# Patient Record
Sex: Male | Born: 1980 | Race: Black or African American | Hispanic: No | Marital: Single | Smoking: Current every day smoker
Health system: Southern US, Community
[De-identification: ages and names within clinical notes are randomized; demographics above are authoritative.]

## PROBLEM LIST (undated history)

## (undated) DIAGNOSIS — I10 Essential (primary) hypertension: Secondary | ICD-10-CM

## (undated) HISTORY — PX: MANDIBLE FRACTURE SURGERY: SHX706

---

## 2007-06-17 ENCOUNTER — Emergency Department: Payer: Self-pay | Admitting: Emergency Medicine

## 2007-06-27 ENCOUNTER — Ambulatory Visit: Payer: Self-pay | Admitting: Specialist

## 2008-10-21 ENCOUNTER — Emergency Department: Payer: Self-pay | Admitting: Internal Medicine

## 2009-04-19 ENCOUNTER — Emergency Department: Payer: Self-pay | Admitting: Unknown Physician Specialty

## 2010-12-14 ENCOUNTER — Emergency Department: Payer: Self-pay | Admitting: Emergency Medicine

## 2010-12-16 ENCOUNTER — Ambulatory Visit: Payer: Self-pay | Admitting: Internal Medicine

## 2012-08-09 ENCOUNTER — Emergency Department: Payer: Self-pay | Admitting: Internal Medicine

## 2012-11-02 ENCOUNTER — Emergency Department: Payer: Self-pay | Admitting: Emergency Medicine

## 2014-10-21 ENCOUNTER — Emergency Department: Payer: 59

## 2014-10-21 ENCOUNTER — Emergency Department
Admission: EM | Admit: 2014-10-21 | Discharge: 2014-10-21 | Disposition: A | Discharge status: Home / Self Care | Payer: 59 | Attending: Emergency Medicine | Admitting: Emergency Medicine

## 2014-10-21 ENCOUNTER — Encounter: Payer: Self-pay | Admitting: Emergency Medicine

## 2014-10-21 DIAGNOSIS — Z720 Tobacco use: Secondary | ICD-10-CM | POA: Diagnosis not present

## 2014-10-21 DIAGNOSIS — J189 Pneumonia, unspecified organism: Secondary | ICD-10-CM | POA: Diagnosis not present

## 2014-10-21 DIAGNOSIS — R509 Fever, unspecified: Secondary | ICD-10-CM | POA: Diagnosis present

## 2014-10-21 DIAGNOSIS — J181 Lobar pneumonia, unspecified organism: Secondary | ICD-10-CM

## 2014-10-21 MED ORDER — PSEUDOEPHEDRINE HCL 30 MG PO TABS: 30.0000 mg | ORAL_TABLET | Freq: Four times a day (QID) | ORAL | Status: DC | PRN

## 2014-10-21 MED ORDER — LEVOFLOXACIN 500 MG PO TABS: 500.0000 mg | ORAL_TABLET | Freq: Every day | ORAL | Status: AC

## 2014-10-21 MED ORDER — GUAIFENESIN-CODEINE 100-10 MG/5ML PO SOLN: 10.0000 mL | ORAL | Status: DC | PRN

## 2014-10-21 MED ORDER — IBUPROFEN 800 MG PO TABS: 800.0000 mg | ORAL_TABLET | Freq: Three times a day (TID) | ORAL | Status: DC | PRN

## 2014-10-21 NOTE — ED Notes (Signed)
Pt here with neck stiffness, pt states that he has had stiffness for the last several days, that he has been running a fever. Pt states that he has had cough and congestion.  Pt states that he has been coughing up blood.

## 2014-10-21 NOTE — Discharge Instructions (Signed)

## 2014-10-21 NOTE — ED Notes (Signed)
Reports "crook in my neck " x 2 days.  Also reports cough, congestion and chills

## 2014-10-21 NOTE — ED Provider Notes (Signed)
[facility] Emergency Department Provider Note    ____________________________________________  Time seen: 12:30  I have reviewed the triage vital signs and the nursing notes.   HISTORY  Chief Complaint Influenza       HPI Paul Good is a 34 y.o. male presents to the ER with complaints of fever chills body aches cough congestion onset about 1 day ago. Has tried several different over-the-counter medications with no relief yesterday and today, just feels bad unable to work. Reports that he is coughing up blood mixed with green sputum suggests today. Unable to get comfortable and better find a spot comfortable to rest symptoms are exacerbated with laying down and relieved somewhat with upright position.    History reviewed. No pertinent past medical history.  There are no active problems to display for this patient.   Past Surgical History  Procedure Laterality Date  . Mandible fracture surgery Bilateral     Current Outpatient Rx  Name  Route  Sig  Dispense  Refill  . guaiFENesin-codeine 100-10 MG/5ML syrup   Oral   Take 10 mLs by mouth every 4 (four) hours as needed for cough.   240 mL   0   . ibuprofen (ADVIL,MOTRIN) 800 MG tablet   Oral   Take 1 tablet (800 mg total) by mouth every 8 (eight) hours as needed.   30 tablet   0   . levofloxacin (LEVAQUIN) 500 MG tablet   Oral   Take 1 tablet (500 mg total) by mouth daily.   10 tablet   0   . pseudoephedrine (SUDAFED) 30 MG tablet   Oral   Take 1 tablet (30 mg total) by mouth every 6 (six) hours as needed for congestion.   24 tablet   2     Allergies Review of patient's allergies indicates no known allergies.  History reviewed. No pertinent family history.  Social History History  Substance Use Topics  . Smoking status: Current Every Day Smoker  . Smokeless tobacco: Not on file  . Alcohol Use: Yes    Review of Systems Constitutional: Negative for fever. Eyes:  Negative for visual changes. ENT: Negative for sore throat. Cardiovascular: Negative for chest pain. Respiratory: Negative for shortness of breath. Decreased breath sounds bilaterally. Gastrointestinal: Negative for abdominal pain, vomiting and diarrhea. Genitourinary: Negative for dysuria. Musculoskeletal: Negative for back pain. Skin: Negative for rash. Neurological: Negative for headaches, focal weakness or numbness.   10-point ROS otherwise negative.  ____________________________________________   PHYSICAL EXAM:  VITAL SIGNS: ED Triage Vitals  Enc Vitals Group     BP 10/21/14 1111 149/98 mmHg     Pulse Rate 10/21/14 1111 78     Resp 10/21/14 1111 18     Temp 10/21/14 1111 98.6 F (37 C)     Temp Source 10/21/14 1111 Oral     SpO2 10/21/14 1111 100 %     Weight 10/21/14 1111 185 lb (83.915 kg)     Height 10/21/14 1111 6' (1.829 m)     Head Cir --      Peak Flow --      Pain Score 10/21/14 1112 10     Pain Loc --      Pain Edu? --      Excl. in GC? --      Constitutional: Alert and oriented. Well appearing and in no distress. Eyes: Conjunctivae are normal. PERRL. Normal extraocular movements. ENT   Head: Normocephalic and atraumatic.   Nose: No  congestion/rhinnorhea.   Mouth/Throat: Mucous membranes are moist.   Neck: No stridor. Hematological/Lymphatic/Immunilogical: No cervical lymphadenopathy. Cardiovascular: Normal rate, regular rhythm. Normal and symmetric distal pulses are present in all extremities. No murmurs, rubs, or gallops. Respiratory: Normal respiratory effort without tachypnea nor retractions. Breath sounds are clear, although decreased and equal bilaterally. No wheezes/rales/rhonchi. Coughs during entire exam with inspiration. Symptoms exacerbated with deep breathing.  Musculoskeletal: Nontender with normal range of motion in all extremities. No joint effusions.  No lower extremity tenderness nor edema. Neurologic:  Normal speech and  language. No gross focal neurologic deficits are appreciated. Speech is normal. No gait instability. Skin:  Skin is warm, dry and intact. No rash noted. Psychiatric: Mood and affect are normal. Speech and behavior are normal. Patient exhibits appropriate insight and judgment.  ____________________________________________    LABS (pertinent positives/negatives)    ____________________________________________   EKG   ____________________________________________    RADIOLOGY  Reviewed and discussed the findings with patient  ____________________________________________   PROCEDURES  Procedure(s) performed: None  Critical Care performed: No  ____________________________________________   INITIAL IMPRESSION / ASSESSMENT AND PLAN / ED COURSE  Pertinent labs & imaging results that were available during my care of the patient were reviewed by me and considered in my medical decision making (see chart for details).    ____________________________________________   FINAL CLINICAL IMPRESSION(S) / ED DIAGNOSES  Final diagnoses:  Left lower lobe pneumonia     Evangeline DakinCharles M Chosen Garron, PA-C 10/21/14 1420  Sharman CheekPhillip Stafford, MD 10/23/14 [phone]

## 2017-12-15 ENCOUNTER — Emergency Department
Admission: EM | Admit: 2017-12-15 | Discharge: 2017-12-15 | Disposition: A | Discharge status: Home / Self Care | Payer: 59 | Attending: Emergency Medicine | Admitting: Emergency Medicine

## 2017-12-15 ENCOUNTER — Encounter: Payer: Self-pay | Admitting: Emergency Medicine

## 2017-12-15 ENCOUNTER — Other Ambulatory Visit: Payer: Self-pay

## 2017-12-15 ENCOUNTER — Emergency Department: Payer: 59

## 2017-12-15 DIAGNOSIS — L089 Local infection of the skin and subcutaneous tissue, unspecified: Secondary | ICD-10-CM | POA: Insufficient documentation

## 2017-12-15 DIAGNOSIS — S6992XA Unspecified injury of left wrist, hand and finger(s), initial encounter: Secondary | ICD-10-CM | POA: Diagnosis present

## 2017-12-15 DIAGNOSIS — F17210 Nicotine dependence, cigarettes, uncomplicated: Secondary | ICD-10-CM | POA: Diagnosis not present

## 2017-12-15 DIAGNOSIS — Y939 Activity, unspecified: Secondary | ICD-10-CM | POA: Diagnosis not present

## 2017-12-15 DIAGNOSIS — S61542A Puncture wound with foreign body of left wrist, initial encounter: Secondary | ICD-10-CM | POA: Insufficient documentation

## 2017-12-15 DIAGNOSIS — Y999 Unspecified external cause status: Secondary | ICD-10-CM | POA: Insufficient documentation

## 2017-12-15 DIAGNOSIS — Y929 Unspecified place or not applicable: Secondary | ICD-10-CM | POA: Diagnosis not present

## 2017-12-15 DIAGNOSIS — S60852A Superficial foreign body of left wrist, initial encounter: Secondary | ICD-10-CM

## 2017-12-15 DIAGNOSIS — W25XXXA Contact with sharp glass, initial encounter: Secondary | ICD-10-CM | POA: Diagnosis not present

## 2017-12-15 DIAGNOSIS — Z23 Encounter for immunization: Secondary | ICD-10-CM | POA: Diagnosis not present

## 2017-12-15 MED ORDER — TETANUS-DIPHTH-ACELL PERTUSSIS 5-2.5-18.5 LF-MCG/0.5 IM SUSP
0.5000 mL | Freq: Once | INTRAMUSCULAR | Status: AC
  Administered 2017-12-15: 0.5 mL via INTRAMUSCULAR
  Filled 2017-12-15: qty 0.5

## 2017-12-15 MED ORDER — CEPHALEXIN 500 MG PO CAPS: 500.0000 mg | ORAL_CAPSULE | Freq: Three times a day (TID) | ORAL | 0 refills | Status: DC

## 2017-12-15 MED ORDER — NAPROXEN 500 MG PO TABS: 500.0000 mg | ORAL_TABLET | Freq: Two times a day (BID) | ORAL | 0 refills | Status: DC

## 2017-12-15 NOTE — ED Notes (Signed)
Pt reports accident occured 1 month ago where he fell through glass on the first floor and cut his left wrist. Lacerations have healed  Properly but pt reports continued swelling and pain in his wrist. Movement is not impaired but pt works a labor intensive job and has not been able to rest wrist properly. No numbness reported. Color is appropriate and Pulse is intact and equal bilaterally.

## 2017-12-15 NOTE — ED Triage Notes (Addendum)
Pt reports falling through a window on the first floor, cutting his left wrist on glass. States he bandaged himself and did not come to the hospital then. States he has been in pain, with swelling that comes and goes. Unsure if there is glass still in his arm. Scars visible to wrist. Pulses present. Difficulty bending wrist. Reports 8/10 pain.  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

## 2017-12-15 NOTE — ED Provider Notes (Signed)
[facility] Emergency Department Provider Note   ____________________________________________   First MD Initiated Contact with Patient 12/15/17 1129     (approximate)  I have reviewed the triage vital signs and the nursing notes.   HISTORY  Chief Complaint Wrist Pain   HPI Paul Good is a 37 y.o. male is here with complaint of possible foreign body to his left wrist.  Patient states that he had an accident where he fell on glass 1 month ago.  He did not seek medical attention for this.  Since that time he has had some intermittent pain in that area and patient states it occasionally swells up.  He denies any drainage from the area.  He rates his pain as an 8/10.  History reviewed. No pertinent past medical history.  There are no active problems to display for this patient.   Past Surgical History:  Procedure Laterality Date  . MANDIBLE FRACTURE SURGERY Bilateral     Prior to Admission medications   Medication Sig Start Date End Date Taking? Authorizing Provider  cephALEXin (KEFLEX) 500 MG capsule Take 1 capsule (500 mg total) by mouth 3 (three) times daily. 12/15/17   Tommi RumpsSummers, Rhonda L, PA-C  naproxen (NAPROSYN) 500 MG tablet Take 1 tablet (500 mg total) by mouth 2 (two) times daily with a meal. 12/15/17   Tommi RumpsSummers, Rhonda L, PA-C    Allergies Patient has no known allergies.  No family history on file.  Social History Social History   Tobacco Use  . Smoking status: Current Every Day Smoker  Substance Use Topics  . Alcohol use: Yes  . Drug use: Not on file    Review of Systems Constitutional: No fever/chills Cardiovascular: Denies chest pain. Respiratory: Denies shortness of breath. Musculoskeletal: Positive left wrist pain. Skin: Positive healed laceration left wrist. Neurological: Negative for  focal weakness or numbness. ____________________________________________   PHYSICAL EXAM:  VITAL SIGNS: ED Triage Vitals  Enc  Vitals Group     BP 12/15/17 1117 (!) 172/107     Pulse Rate 12/15/17 1117 75     Resp 12/15/17 1117 16     Temp 12/15/17 1117 98.5 F (36.9 C)     Temp Source 12/15/17 1117 Oral     SpO2 12/15/17 1117 98 %     Weight 12/15/17 1118 195 lb (88.5 kg)     Height 12/15/17 1118 6' (1.829 m)     Head Circumference --      Peak Flow --      Pain Score 12/15/17 1118 8     Pain Loc --      Pain Edu? --      Excl. in [place]? --    Constitutional: Alert and oriented. Well appearing and in no acute distress. Eyes: Conjunctivae are normal.  Head: Atraumatic. Neck: No stridor.   Cardiovascular: Normal rate, regular rhythm. Grossly normal heart sounds.  Good peripheral circulation. Respiratory: Normal respiratory effort.  No retractions. Lungs CTAB. Musculoskeletal: On examination of the left wrist there is no bony abnormality.  There is a well-healed scar without drainage on the distal radial aspect of his wrist.  Area is slightly tender to touch.  There is some mild soft tissue swelling in this area.  Patient has full range of motion of his wrist.  Motor sensory function intact.  Capillary refill is less than 3 seconds. Neurologic:  Normal speech and language. No gross focal neurologic deficits are appreciated.  Skin:  Skin is warm, dry  and intact.  As stated above. Psychiatric: Mood and affect are normal. Speech and behavior are normal.  ____________________________________________   LABS (all labs ordered are listed, but only abnormal results are displayed)  Labs Reviewed - No data to display  RADIOLOGY  ED MD interpretation:   Left wrist x-ray shows foreign body.  Official radiology report(s): Dg Wrist Complete Left  Result Date: 12/15/2017 CLINICAL DATA:  37 year old male with a history of laceration to the left wrist after falling through glass. Laceration has healed but there is persistent pain and swelling. EXAM: LEFT WRIST - COMPLETE 3+ VIEW COMPARISON:  None. FINDINGS: There is no  evidence of fracture or dislocation. There is no evidence of arthropathy or other focal bone abnormality. A 4 mm radiopaque foreign body is visualized in the soft tissues dorsal to wrist at the level of the ulnar styloid. This finding is seen only on the lateral view. The abnormality is approximately 7 mm deep to the skin surface. IMPRESSION: 4 mm radiodense foreign body concerning for embedded glass identified in the soft tissues dorsal to the wrist joint approximately 7 mm deep to the skin surface at the level of the ulnar styloid. This abnormality cannot be confidently visualized on either the frontal or oblique radiographs but is questionably overlying the distal radius. No acute osseous abnormality. Electronically Signed   By: Malachy Moan M.D.   On: 12/15/2017 11:54    ____________________________________________   PROCEDURES  Procedure(s) performed: None  Procedures  Critical Care performed: No  ____________________________________________   INITIAL IMPRESSION / ASSESSMENT AND PLAN / ED COURSE  As part of my medical decision making, I reviewed the following data within the electronic MEDICAL RECORD NUMBER Notes from prior ED visits and [place] Controlled Substance Database  Patient presents with questionable foreign body to his left wrist for 1 month.  X-ray does show what is most likely a piece of glass.  Area is well-healed.  Patient was given a prescription for Keflex 500 mg 3 times daily for 7 days and naproxen 500 mg twice daily with food.  Patient is encouraged not to pick at this area.  He was given the orthopedist office phone number to call and make an appointment.  ____________________________________________   FINAL CLINICAL IMPRESSION(S) / ED DIAGNOSES  Final diagnoses:  Infected superficial foreign body of left wrist, initial encounter     ED Discharge Orders        Ordered    cephALEXin (KEFLEX) 500 MG capsule  3 times daily     12/15/17 1208    naproxen  (NAPROSYN) 500 MG tablet  2 times daily with meals     12/15/17 1208       Note:  This document was prepared using Dragon voice recognition software and may include unintentional dictation errors.    Tommi Rumps, PA-C 12/15/17 1239    Dionne Bucy, MD 12/15/17 [phone]

## 2017-12-15 NOTE — ED Notes (Signed)
Patient transported to X-ray 

## 2017-12-15 NOTE — Discharge Instructions (Addendum)
Follow-up with Dr. Martha ClanKrasinski who is the orthopedist on call.  You may also follow-up with Dr. Mathis BudHernandez-Soria who is in the same office and is a Hydrographic surveyorhand surgeon.  Begin taking Keflex 500 mg 3 times daily for 7 days.  Naproxen 500 mg twice daily with food for pain and inflammation.  Do not pick at this area as you can do more damage to this area.

## 2019-01-22 ENCOUNTER — Other Ambulatory Visit: Payer: Self-pay

## 2019-01-22 ENCOUNTER — Encounter: Payer: Self-pay | Admitting: Physician Assistant

## 2019-01-22 ENCOUNTER — Ambulatory Visit: Payer: Self-pay | Admitting: Physician Assistant

## 2019-01-22 DIAGNOSIS — Z113 Encounter for screening for infections with a predominantly sexual mode of transmission: Secondary | ICD-10-CM

## 2019-01-22 NOTE — Progress Notes (Signed)
    STI clinic/screening visit  Subjective:  Paul Good is a 38 y.o. male being seen today for an STI screening visit. The patient reports they do have symptoms.  Patient has the following medical conditions:  There are no active problems to display for this patient.    Chief Complaint  Patient presents with  . SEXUALLY TRANSMITTED DISEASE    HPI  Patient reports that he starting having dysuria about 2 weeks ago and had telehealth visit last week and was prescribed tx for GC.  States that he took Azithromycin and Cefixime on 01/18/19 and is still having some dysuria though it is better.   See flowsheet for further details and programmatic requirements.    The following portions of the patient's history were reviewed and updated as appropriate: allergies, current medications, past medical history, past social history, past surgical history and problem list.  Objective:  There were no vitals filed for this visit.  Physical Exam Constitutional:      Appearance: Normal appearance.  HENT:     Head: Normocephalic and atraumatic.  Pulmonary:     Effort: Pulmonary effort is normal.  Neurological:     Mental Status: He is alert and oriented to person, place, and time.  Psychiatric:        Mood and Affect: Mood normal.        Behavior: Behavior normal.        Thought Content: Thought content normal.        Judgment: Judgment normal.   Screening exam deferred today.     Assessment and Plan:  Paul Good is a 38 y.o. male presenting to the [facility] for STI screening  1. Screening for STD (sexually transmitted disease) Patient into clinic after treatment for GC 4 days ago. Counseled patient that any testing we do for GC/NGU will not be accurate due to his having already been treated for it. Counseled patient that it can take up to 1 week after taking the medicine for the symptoms to completely clear up. Rec no sex until after partner completes  treatment. Rec continue with condoms with all sex Enc to check exp date on condoms since he reports problem with breaking.  Also, counseled on correct storage and reviewed the proper way to put a condom on with patient.  RTC prn.     No follow-ups on file.  No future appointments.  Jerene Dilling, PA

## 2019-01-24 LAB — HM HIV SCREENING LAB: HM HIV Screening: NEGATIVE

## 2019-02-03 IMAGING — CR DG WRIST COMPLETE 3+V*L*
4 series · 4 of 4 positions shown · non-contrast
Comparison: None.

CLINICAL DATA: 37-year-old male with a history of laceration to the
left wrist after falling through glass. Laceration has healed but
there is persistent pain and swelling.

EXAM:
LEFT WRIST - COMPLETE 3+ VIEW

[wrist pa]
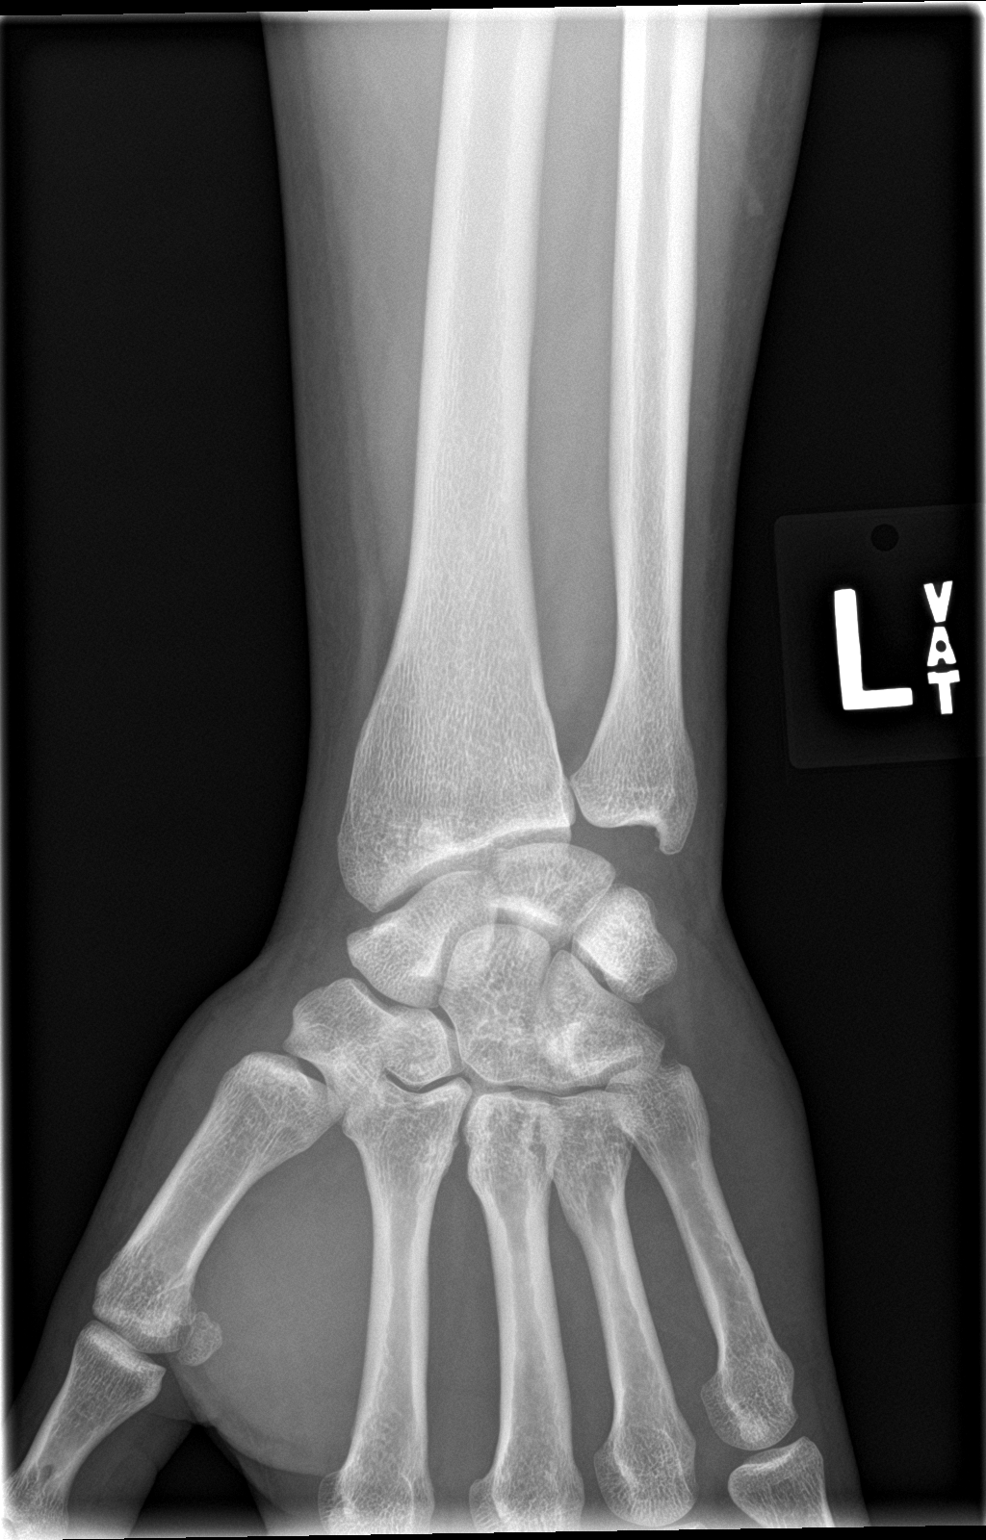

[wrist obl]
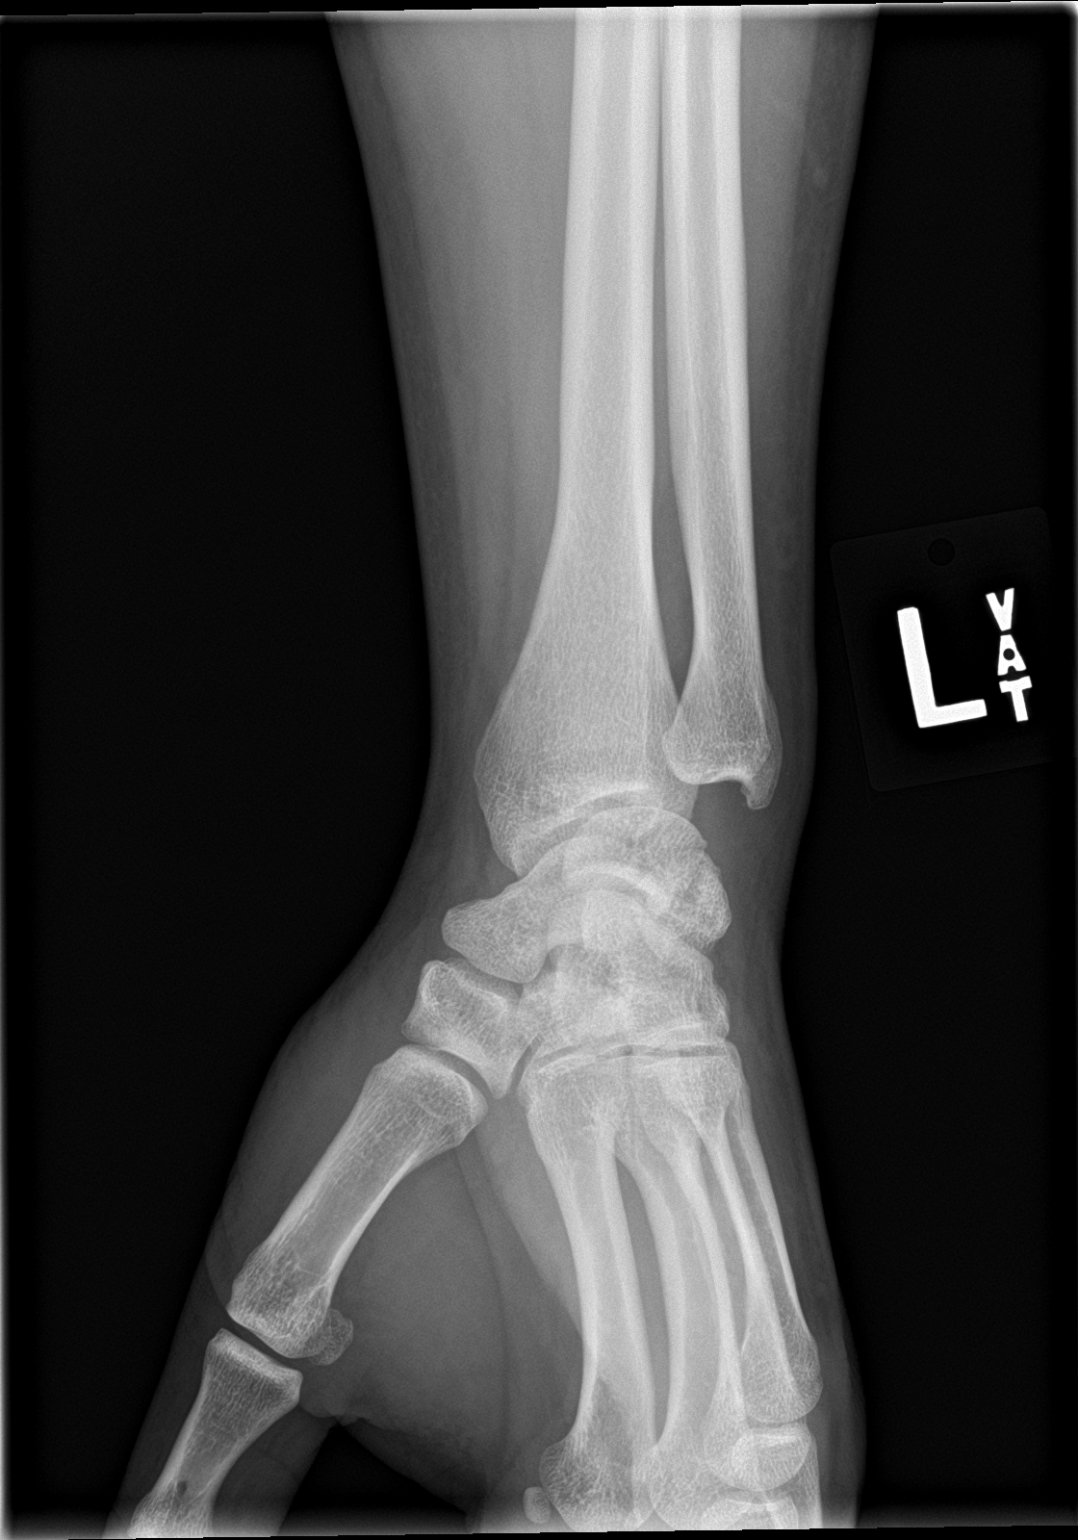

[wrist lat]
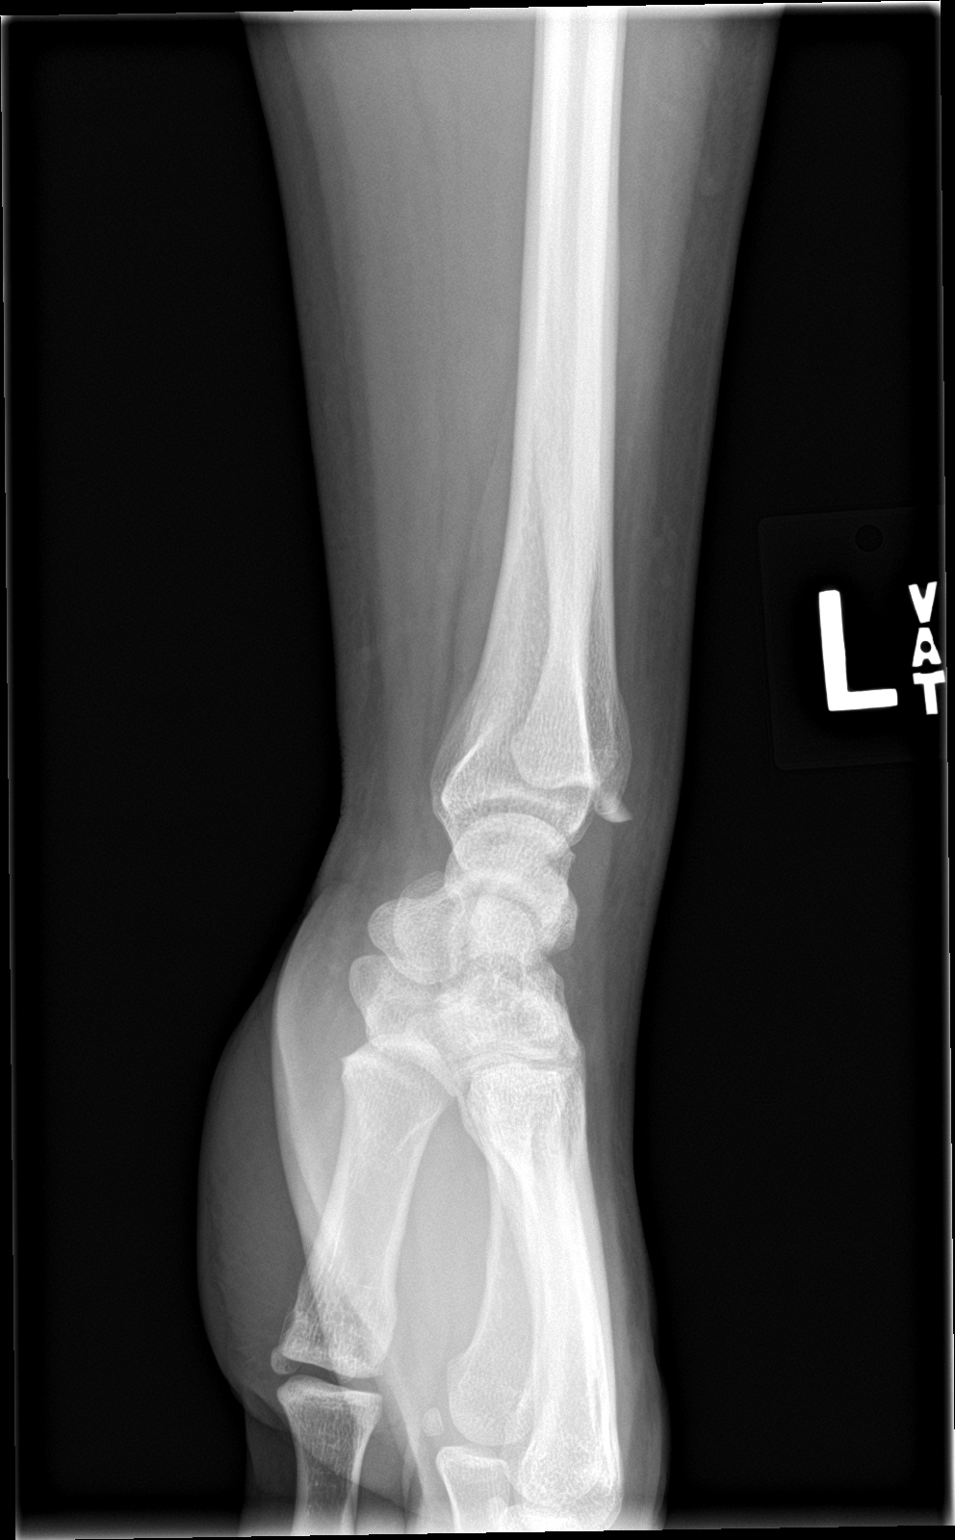

[navicular]
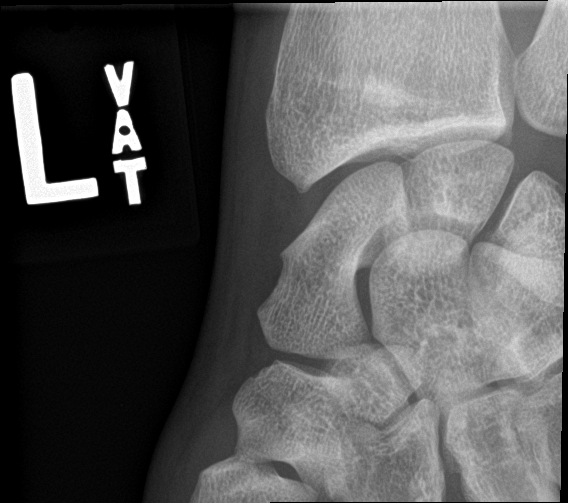

[4 of 4 positions shown; findings below may reference images not displayed]

FINDINGS: There is no evidence of fracture or dislocation. There is no
evidence of arthropathy or other focal bone abnormality. A 4 mm
radiopaque foreign body is visualized in the soft tissues dorsal to
wrist at the level of the ulnar styloid. This finding is seen only
on the lateral view. The abnormality is approximately 7 mm deep to
the skin surface.
IMPRESSION: 4 mm radiodense foreign body concerning for embedded glass
identified in the soft tissues dorsal to the wrist joint
approximately 7 mm deep to the skin surface at the level of the
ulnar styloid. This abnormality cannot be confidently visualized on
either the frontal or oblique radiographs but is questionably
overlying the distal radius.

No acute osseous abnormality.

## 2019-02-07 NOTE — Addendum Note (Signed)
Addended by: Antoine Primas on: 02/07/2019 04:39 PM   Modules accepted: Orders

## 2019-03-07 NOTE — Addendum Note (Signed)
Addended by: Cletis Media on: 03/07/2019 11:02 AM   Modules accepted: Orders

## 2019-11-25 ENCOUNTER — Other Ambulatory Visit: Payer: Self-pay

## 2019-11-25 ENCOUNTER — Telehealth: Payer: Self-pay | Admitting: Family Medicine

## 2019-11-25 ENCOUNTER — Ambulatory Visit
Admission: EM | Admit: 2019-11-25 | Discharge: 2019-11-25 | Disposition: A | Discharge status: Home / Self Care | Payer: 59 | Attending: Family Medicine | Admitting: Family Medicine

## 2019-11-25 DIAGNOSIS — K4090 Unilateral inguinal hernia, without obstruction or gangrene, not specified as recurrent: Secondary | ICD-10-CM | POA: Diagnosis not present

## 2019-11-25 HISTORY — DX: Essential (primary) hypertension: I10

## 2019-11-25 LAB — COMPREHENSIVE METABOLIC PANEL
ALT: 42 U/L (ref 0–44)
AST: 25 U/L (ref 15–41)
Albumin: 5.2 g/dL — ABNORMAL HIGH (ref 3.5–5.0)
Alkaline Phosphatase: 52 U/L (ref 38–126)
Anion gap: 10 (ref 5–15)
BUN: 12 mg/dL (ref 6–20)
CO2: 28 mmol/L (ref 22–32)
Calcium: 9.7 mg/dL (ref 8.9–10.3)
Chloride: 98 mmol/L (ref 98–111)
Creatinine, Ser: 0.83 mg/dL (ref 0.61–1.24)
GFR calc Af Amer: >60 mL/min (ref >60)
GFR calc non Af Amer: >60 mL/min (ref >60)
Glucose, Bld: 93 mg/dL (ref 70–99)
Potassium: 4.5 mmol/L (ref 3.5–5.1)
Sodium: 136 mmol/L (ref 135–145)
Total Bilirubin: 1.1 mg/dL (ref 0.3–1.2)
Total Protein: 9.1 g/dL — ABNORMAL HIGH (ref 6.5–8.1)

## 2019-11-25 LAB — CBC WITH DIFFERENTIAL/PLATELET
Abs Immature Granulocytes: 0.09 10*3/uL — ABNORMAL HIGH (ref 0.00–0.07)
Basophils Absolute: 0.1 10*3/uL (ref 0.0–0.1)
Basophils Relative: 1 %
Eosinophils Absolute: 0.3 10*3/uL (ref 0.0–0.5)
Eosinophils Relative: 3 %
HCT: 45.8 % (ref 39.0–52.0)
Hemoglobin: 15.2 g/dL (ref 13.0–17.0)
Immature Granulocytes: 1 %
Lymphocytes Relative: 33 %
Lymphs Abs: 2.6 10*3/uL (ref 0.7–4.0)
MCH: 31.1 pg (ref 26.0–34.0)
MCHC: 33.2 g/dL (ref 30.0–36.0)
MCV: 93.7 fL (ref 80.0–100.0)
Monocytes Absolute: 1.1 10*3/uL — ABNORMAL HIGH (ref 0.1–1.0)
Monocytes Relative: 13 %
Neutro Abs: 3.9 10*3/uL (ref 1.7–7.7)
Neutrophils Relative %: 49 %
Platelets: 284 10*3/uL (ref 150–400)
RBC: 4.89 MIL/uL (ref 4.22–5.81)
RDW: 12.2 % (ref 11.5–15.5)
WBC: 8 10*3/uL (ref 4.0–10.5)
nRBC: 0 % (ref 0.0–0.2)

## 2019-11-25 LAB — URINALYSIS, COMPLETE (UACMP) WITH MICROSCOPIC
Bacteria, UA: NONE SEEN
Bilirubin Urine: NEGATIVE
Glucose, UA: NEGATIVE mg/dL
Leukocytes,Ua: NEGATIVE
Nitrite: NEGATIVE
Protein, ur: NEGATIVE mg/dL
Specific Gravity, Urine: 1.025 (ref 1.005–1.030)
Squamous Epithelial / LPF: NONE SEEN (ref 0–5)
WBC, UA: NONE SEEN WBC/hpf (ref 0–5)
pH: 5.5 (ref 5.0–8.0)

## 2019-11-25 MED ORDER — TRAMADOL HCL 50 MG PO TABS: 50.0000 mg | ORAL_TABLET | Freq: Three times a day (TID) | ORAL | 0 refills | Status: AC | PRN

## 2019-11-25 NOTE — Telephone Encounter (Signed)
Referral placed for Gen surgery.  Everlene Other DO [facility]

## 2019-11-25 NOTE — Discharge Instructions (Signed)
Lots of fluids.  Medication as needed.  I will place referral to surgery.  Take care  Dr. Adriana [facility]

## 2019-11-25 NOTE — ED Provider Notes (Signed)
[facility]    CSN: [id number] Arrival date & time: 11/25/19  1733      History   Chief Complaint Chief Complaint  Patient presents with  . Groin Swelling   HPI  39 year old male presents with right inguinal pain, and swelling as well as difficulty urinating.  Patient reports a 2-day history of right inguinal pain and associated swelling.  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  He states that recently he lifted a large package of candy at his job which weighed at least 100 pounds.  Patient reports that his pain is currently mild but has been persistent.  He also reports intermittent difficulty urinating.  Patient has a good stream and good flow.  However, he states that he has had several instances where he went to urinate and was unable to do so.  He states that this resolved after increasing his fluid intake.  Denies constipation.  Denies rectal pain.  No relieving factors.  No other associated symptoms.  No other complaints.  Past Medical History:  Diagnosis Date  . Hypertension     There are no problems to display for this patient.   Past Surgical History:  Procedure Laterality Date  . MANDIBLE FRACTURE SURGERY Bilateral        Home Medications    Prior to Admission medications   Medication Sig Start Date End Date Taking? Authorizing Provider  traMADol (ULTRAM) 50 MG tablet Take 1 tablet (50 mg total) by mouth every 8 (eight) hours as needed for moderate pain or severe pain. 11/25/19   Coral Spikes, DO    Family History Family History  Problem Relation Age of Onset  . Hypertension Mother   . Hypertension Father   . Diabetes Father     Social History Social History   Tobacco Use  . Smoking status: Current Every Day Smoker    Packs/day: 0.25    Types: Cigarettes  . Smokeless tobacco: Never Used  Substance Use Topics  . Alcohol use: Yes    Comment: social  . Drug use: Never     Allergies   Patient has no known  allergies.   Review of Systems Review of Systems  Constitutional: Negative.   Gastrointestinal: Negative for constipation.  Genitourinary: Positive for difficulty urinating.       Right inguinal pain and swelling.   Physical Exam Triage Vital Signs ED Triage Vitals  Enc Vitals Group     BP 11/25/19 1801 (!) 182/109     Pulse Rate 11/25/19 1801 73     Resp 11/25/19 1801 18     Temp 11/25/19 1801 98.2 F (36.8 C)     Temp Source 11/25/19 1801 Oral     SpO2 11/25/19 1801 100 %     Weight 11/25/19 1805 205 lb (93 kg)     Height 11/25/19 1805 6' (1.829 m)     Head Circumference --      Peak Flow --      Pain Score 11/25/19 1805 1     Pain Loc --      Pain Edu? --      Excl. in [place]? --    Updated Vital Signs BP (!) 182/109 (BP Location: Left Arm)   Pulse 73   Temp 98.2 F (36.8 C) (Oral)   Resp 18   Ht 6' (1.829 m)   Wt 93 kg   SpO2 100%   BMI 27.80 kg/m   Visual Acuity Right  Eye Distance:   Left Eye Distance:   Bilateral Distance:    Right Eye Near:   Left Eye Near:    Bilateral Near:     Physical Exam Vitals and nursing note reviewed.  Constitutional:      General: He is not in acute distress.    Appearance: Normal appearance. He is not ill-appearing.  HENT:     Head: Normocephalic and atraumatic.  Eyes:     General:        Right eye: No discharge.        Left eye: No discharge.     Conjunctiva/sclera: Conjunctivae normal.  Cardiovascular:     Rate and Rhythm: Normal rate and regular rhythm.     Heart sounds: No murmur.  Pulmonary:     Effort: Pulmonary effort is normal.     Breath sounds: Normal breath sounds. No wheezing, rhonchi or rales.  Abdominal:     Palpations: Abdomen is soft.  Genitourinary:    Comments: Right inguinal bulge.  Exam consistent with hernia. Neurological:     Mental Status: He is alert.  Psychiatric:        Mood and Affect: Mood normal.        Behavior: Behavior normal.    UC Treatments / Results  Labs (all labs  ordered are listed, but only abnormal results are displayed) Labs Reviewed  CBC WITH DIFFERENTIAL/PLATELET - Abnormal; Notable for the following components:      Result Value   Monocytes Absolute 1.1 (*)    Abs Immature Granulocytes 0.09 (*)    All other components within normal limits  COMPREHENSIVE METABOLIC PANEL - Abnormal; Notable for the following components:   Total Protein 9.1 (*)    Albumin 5.2 (*)    All other components within normal limits  URINALYSIS, COMPLETE (UACMP) WITH MICROSCOPIC - Abnormal; Notable for the following components:   Hgb urine dipstick TRACE (*)    Ketones, ur TRACE (*)    All other components within normal limits    EKG   Radiology No results found.  Procedures Procedures (including critical care time)  Medications Ordered in UC Medications - No data to display  Initial Impression / Assessment and Plan / UC Course  I have reviewed the triage vital signs and the nursing notes.  Pertinent labs & imaging results that were available during my care of the patient were reviewed by me and considered in my medical decision making (see chart for details).    39 year old male presents with right inguinal pain and swelling as well as difficulty urinating.  Urinalysis normal today.  CBC unremarkable.  Metabolic panel with elevated protein which is of unknown clear significance at this time.  Otherwise unremarkable.  Exam significant for inguinal hernia.  Offered CT scan and patient elected to wait.  Referring to general surgery.  Tramadol as needed for pain.  Final Clinical Impressions(s) / UC Diagnoses   Final diagnoses:  Unilateral inguinal hernia without obstruction or gangrene, recurrence not specified     Discharge Instructions     Lots of fluids.  Medication as needed.  I will place referral to surgery.  Take care  Dr. [facility]   ED Prescriptions    Medication Sig Dispense Auth. Provider   traMADol (ULTRAM) 50 MG tablet Take 1 tablet  (50 mg total) by mouth every 8 (eight) hours as needed for moderate pain or severe pain. 15 tablet Tommie Sams, DO     I have reviewed the PDMP  during this encounter.   Tommie Sams, [place] 11/25/19 2156

## 2019-11-25 NOTE — ED Triage Notes (Signed)
Pt states 2 weeks of right sided lower abdominal swelling/pain, difficulty with urination.

## 2019-11-28 ENCOUNTER — Other Ambulatory Visit: Payer: Self-pay

## 2019-11-28 ENCOUNTER — Telehealth: Payer: Self-pay | Admitting: Surgery

## 2019-11-28 ENCOUNTER — Ambulatory Visit: Payer: 59 | Admitting: Surgery

## 2019-11-28 ENCOUNTER — Encounter: Payer: Self-pay | Admitting: Surgery

## 2019-11-28 VITALS — BP 157/107 | HR 76 | Temp 96.6°F | Ht 72.0 in | Wt 204.6 lb

## 2019-11-28 DIAGNOSIS — K4090 Unilateral inguinal hernia, without obstruction or gangrene, not specified as recurrent: Secondary | ICD-10-CM

## 2019-11-28 NOTE — Progress Notes (Signed)
Patient ID: Paul Good, male   DOB: [birth year], 39 y.o.   MRN: [id number]  Chief Complaint: Right groin hernia  History of Present Illness Paul Good is a 39 y.o. male with a 2-week history of right groin pressure/pain.  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  Its been progressive over the last 2 weeks, he denies any change in the size of the mass to the groin.  His pain is a level 4 out of 10.  He denies any bowel habit changes, no diarrhea, no constipation, no nausea nor vomiting.  He has no history of fevers or chills.  Pain is a dull ache and stable and constant.  The mass is not spontaneously reducing over night, or with recumbent positioning.  Past Medical History Past Medical History:  Diagnosis Date  . Hypertension       Past Surgical History:  Procedure Laterality Date  . MANDIBLE FRACTURE SURGERY Bilateral     No Known Allergies  Current Outpatient Medications  Medication Sig Dispense Refill  . hydrochlorothiazide (HYDRODIURIL) 25 MG tablet hydrochlorothiazide 25 mg tablet  TAKE 1 TABLET BY MOUTH ONCE DAILY    . traMADol (ULTRAM) 50 MG tablet Take 1 tablet (50 mg total) by mouth every 8 (eight) hours as needed for moderate pain or severe pain. 15 tablet 0  . promethazine (PHENERGAN) 25 MG tablet promethazine 25 mg tablet  TAKE 1 TABLET BY MOUTH EVERY 4 TO 6 HOURS AS NEEDED FOR NAUSEA AND PAIN (Patient not taking: Reported on 11/28/2019)     No current facility-administered medications for this visit.    Family History Family History  Problem Relation Age of Onset  . Hypertension Mother   . Hypertension Father   . Diabetes Father       Social History Social History   Tobacco Use  . Smoking status: Current Every Day Smoker    Packs/day: 0.25    Types: Cigarettes  . Smokeless tobacco: Never Used  Substance Use Topics  . Alcohol use: Yes    Comment: social  . Drug use: Never        Review of Systems   Constitutional: Negative.   HENT: Negative.   Eyes: Negative.   Respiratory: Negative.   Cardiovascular: Negative.   Gastrointestinal: Negative.   Genitourinary: Positive for dysuria, frequency and urgency.  Skin: Negative.   Neurological: Negative.   Endo/Heme/Allergies: Negative.   Psychiatric/Behavioral: Negative.       Physical Exam Blood pressure (!) 157/107, pulse 76, temperature (!) 96.6 F (35.9 C), temperature source Temporal, height 6' (1.829 m), weight 204 lb 9.6 oz (92.8 kg), SpO2 95 %. Last Weight  Most recent update: 11/28/2019  3:18 PM   Weight  92.8 kg (204 lb 9.6 oz)            CONSTITUTIONAL: Well developed, and nourished, appropriately responsive and aware without distress.   EYES: Sclera non-icteric.   EARS, NOSE, MOUTH AND THROAT: Mask worn.    Hearing is intact to voice.  NECK: Trachea is midline, and there is no jugular venous distension.  LYMPH NODES:  Lymph nodes in the neck are not enlarged. RESPIRATORY:  Lungs are clear, and breath sounds are equal bilaterally. Normal respiratory effort without pathologic use of accessory muscles. CARDIOVASCULAR: Heart is regular in rate and rhythm. GI: The abdomen is soft, nontender, and nondistended. There were no palpable masses. I did not appreciate hepatosplenomegaly. There were normal bowel sounds. GU: There is  a large irreducible right groin hernia, though technically incarcerated, this appears to be chronic, and clinically without involvement of intestinal viscera.  Testes are descended bilaterally, I do not appreciate a left sided hernia. MUSCULOSKELETAL:  Symmetrical muscle tone appreciated in all four extremities.    SKIN: Skin turgor is normal. No pathologic skin lesions appreciated.  NEUROLOGIC:  Motor and sensation appear grossly normal.  Cranial nerves are grossly without defect. PSYCH:  Alert and oriented to person, place and time. Affect is appropriate for situation.  Data Reviewed I have personally  reviewed what is currently available of the patient's imaging, recent labs and medical records.   Labs:  CBC Latest Ref Rng & Units 11/25/2019  WBC 4.0 - 10.5 K/uL 8.0  Hemoglobin 13.0 - 17.0 g/dL 53.2  Hematocrit 39 - 52 % 45.8  Platelets 150 - 400 K/uL 284   CMP Latest Ref Rng & Units 11/25/2019  Glucose 70 - 99 mg/dL 93  BUN 6 - 20 mg/dL 12  Creatinine 9.92 - 4.26 mg/dL 8.34  Sodium 196 - 222 mmol/L 136  Potassium 3.5 - 5.1 mmol/L 4.5  Chloride 98 - 111 mmol/L 98  CO2 22 - 32 mmol/L 28  Calcium 8.9 - 10.3 mg/dL 9.7  Total Protein 6.5 - 8.1 g/dL 9.1(H)  Total Bilirubin 0.3 - 1.2 mg/dL 1.1  Alkaline Phos 38 - 126 U/L 52  AST 15 - 41 U/L 25  ALT 0 - 44 U/L 42      Imaging:  Within last 24 hrs: No results found.  Assessment    Right inguinal hernia. There are no problems to display for this patient.   Plan    Robotic repair of right inguinal hernia, possible bilateral. I discussed possibility of incarceration, strangulation, enlargement in size over time, and the risk of emergency surgery in the face of strangulation.  Also discussed the risk of surgery including recurrence which can be up to 30% in the case of complex hernias, use of prosthetic materials (mesh) and the increased risk of infxn, post-op infxn and the possible need for re-operation and removal of mesh if used, possibility of post-op SBO or ileus, and the risks of general anesthetic including MI, CVA, sudden death or even reaction to anesthetic medications. The patient and family understands the risks, any and all questions were answered to the patient's satisfaction.  Face-to-face time spent with the patient and accompanying care providers(if present) was 30 minutes, with more than 50% of the time spent counseling, educating, and coordinating care of the patient.      Campbell Lerner M.D., FACS 11/28/2019, 4:14 PM

## 2019-11-28 NOTE — Telephone Encounter (Signed)
Pt has been advised of Pre-Admission date/time, COVID Testing date and Surgery date.  Surgery Date: 12/02/19 Preadmission Testing Date: 12/02/19 (Office, patient informed to arrive 8:45 am) Covid Testing Date: 11/29/19 - patient advised to [facility] ([address] [facility]) between 8a-1p  Surgery add on, per preadmissions, patient to arrive 2 hours early.  Patient aware of this.

## 2019-11-28 NOTE — H&P (View-Only) (Signed)
Patient ID: Paul Good, male   DOB: 07/06/1980, 39 y.o.   MRN: [id number]  Chief Complaint: Right groin hernia  History of Present Illness Paul Good is a 39 y.o. male with a 2-week history of right groin pressure/pain.  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  Its been progressive over the last 2 weeks, he denies any change in the size of the mass to the groin.  His pain is a level 4 out of 10.  He denies any bowel habit changes, no diarrhea, no constipation, no nausea nor vomiting.  He has no history of fevers or chills.  Pain is a dull ache and stable and constant.  The mass is not spontaneously reducing over night, or with recumbent positioning.  Past Medical History Past Medical History:  Diagnosis Date  . Hypertension       Past Surgical History:  Procedure Laterality Date  . MANDIBLE FRACTURE SURGERY Bilateral     No Known Allergies  Current Outpatient Medications  Medication Sig Dispense Refill  . hydrochlorothiazide (HYDRODIURIL) 25 MG tablet hydrochlorothiazide 25 mg tablet  TAKE 1 TABLET BY MOUTH ONCE DAILY    . traMADol (ULTRAM) 50 MG tablet Take 1 tablet (50 mg total) by mouth every 8 (eight) hours as needed for moderate pain or severe pain. 15 tablet 0  . promethazine (PHENERGAN) 25 MG tablet promethazine 25 mg tablet  TAKE 1 TABLET BY MOUTH EVERY 4 TO 6 HOURS AS NEEDED FOR NAUSEA AND PAIN (Patient not taking: Reported on 11/28/2019)     No current facility-administered medications for this visit.    Family History Family History  Problem Relation Age of Onset  . Hypertension Mother   . Hypertension Father   . Diabetes Father       Social History Social History   Tobacco Use  . Smoking status: Current Every Day Smoker    Packs/day: 0.25    Types: Cigarettes  . Smokeless tobacco: Never Used  Substance Use Topics  . Alcohol use: Yes    Comment: social  . Drug use: Never        Review of Systems   Constitutional: Negative.   HENT: Negative.   Eyes: Negative.   Respiratory: Negative.   Cardiovascular: Negative.   Gastrointestinal: Negative.   Genitourinary: Positive for dysuria, frequency and urgency.  Skin: Negative.   Neurological: Negative.   Endo/Heme/Allergies: Negative.   Psychiatric/Behavioral: Negative.       Physical Exam Blood pressure (!) 157/107, pulse 76, temperature (!) 96.6 F (35.9 C), temperature source Temporal, height 6' (1.829 m), weight 204 lb 9.6 oz (92.8 kg), SpO2 95 %. Last Weight  Most recent update: 11/28/2019  3:18 PM   Weight  92.8 kg (204 lb 9.6 oz)            CONSTITUTIONAL: Well developed, and nourished, appropriately responsive and aware without distress.   EYES: Sclera non-icteric.   EARS, NOSE, MOUTH AND THROAT: Mask worn.    Hearing is intact to voice.  NECK: Trachea is midline, and there is no jugular venous distension.  LYMPH NODES:  Lymph nodes in the neck are not enlarged. RESPIRATORY:  Lungs are clear, and breath sounds are equal bilaterally. Normal respiratory effort without pathologic use of accessory muscles. CARDIOVASCULAR: Heart is regular in rate and rhythm. GI: The abdomen is soft, nontender, and nondistended. There were no palpable masses. I did not appreciate hepatosplenomegaly. There were normal bowel sounds. GU: There is   a large irreducible right groin hernia, though technically incarcerated, this appears to be chronic, and clinically without involvement of intestinal viscera.  Testes are descended bilaterally, I do not appreciate a left sided hernia. MUSCULOSKELETAL:  Symmetrical muscle tone appreciated in all four extremities.    SKIN: Skin turgor is normal. No pathologic skin lesions appreciated.  NEUROLOGIC:  Motor and sensation appear grossly normal.  Cranial nerves are grossly without defect. PSYCH:  Alert and oriented to person, place and time. Affect is appropriate for situation.  Data Reviewed I have personally  reviewed what is currently available of the patient's imaging, recent labs and medical records.   Labs:  CBC Latest Ref Rng & Units 11/25/2019  WBC 4.0 - 10.5 K/uL 8.0  Hemoglobin 13.0 - 17.0 g/dL 53.2  Hematocrit 39 - 52 % 45.8  Platelets 150 - 400 K/uL 284   CMP Latest Ref Rng & Units 11/25/2019  Glucose 70 - 99 mg/dL 93  BUN 6 - 20 mg/dL 12  Creatinine 9.92 - 4.26 mg/dL 8.34  Sodium 196 - 222 mmol/L 136  Potassium 3.5 - 5.1 mmol/L 4.5  Chloride 98 - 111 mmol/L 98  CO2 22 - 32 mmol/L 28  Calcium 8.9 - 10.3 mg/dL 9.7  Total Protein 6.5 - 8.1 g/dL 9.1(H)  Total Bilirubin 0.3 - 1.2 mg/dL 1.1  Alkaline Phos 38 - 126 U/L 52  AST 15 - 41 U/L 25  ALT 0 - 44 U/L 42      Imaging:  Within last 24 hrs: No results found.  Assessment    Right inguinal hernia. There are no problems to display for this patient.   Plan    Robotic repair of right inguinal hernia, possible bilateral. I discussed possibility of incarceration, strangulation, enlargement in size over time, and the risk of emergency surgery in the face of strangulation.  Also discussed the risk of surgery including recurrence which can be up to 30% in the case of complex hernias, use of prosthetic materials (mesh) and the increased risk of infxn, post-op infxn and the possible need for re-operation and removal of mesh if used, possibility of post-op SBO or ileus, and the risks of general anesthetic including MI, CVA, sudden death or even reaction to anesthetic medications. The patient and family understands the risks, any and all questions were answered to the patient's satisfaction.  Face-to-face time spent with the patient and accompanying care providers(if present) was 30 minutes, with more than 50% of the time spent counseling, educating, and coordinating care of the patient.      Campbell Lerner M.D., FACS 11/28/2019, 4:14 PM

## 2019-11-28 NOTE — Patient Instructions (Addendum)
Our surgery scheduler Pamala Hurry will contact you within the next 24-48 hrs to get scheduled. During that call, she will discuss the preparation prior to surgery and also discuss the different dates and times. Please have the BLUE sheet available when she contacts you. If you have any questions regarding the surgery, please do not hesitate to give our office a call back.   Inguinal Hernia, Adult An inguinal hernia develops when fat or the intestines push through a weak spot in a muscle where your leg meets your lower abdomen (groin). This creates a bulge. This kind of hernia could also be:  In your scrotum, if you are male.  In folds of skin around your vagina, if you are male. There are three types of inguinal hernias:  Hernias that can be pushed back into the abdomen (are reducible). This type rarely causes pain.  Hernias that are not reducible (are incarcerated).  Hernias that are not reducible and lose their blood supply (are strangulated). This type of hernia requires emergency surgery. What are the causes? This condition is caused by having a weak spot in the muscles or tissues in the groin. This weak spot develops over time. The hernia may poke through the weak spot when you suddenly strain your lower abdominal muscles, such as when you:  Lift a heavy object.  Strain to have a bowel movement. Constipation can lead to straining.  Cough. What increases the risk? This condition is more likely to develop in:  Men.  Pregnant women.  People who: ? Are overweight. ? Work in jobs that require long periods of standing or heavy lifting. ? Have had an inguinal hernia before. ? Smoke or have lung disease. These factors can lead to long-lasting (chronic) coughing. What are the signs or symptoms? Symptoms may depend on the size of the hernia. Often, a small inguinal hernia has no symptoms. Symptoms of a larger hernia may include:  A lump in the groin area. This is easier to see when  standing. It might not be visible when lying down.  Pain or burning in the groin. This may get worse when lifting, straining, or coughing.  A dull ache or a feeling of pressure in the groin.  In men, an unusual lump in the scrotum. Symptoms of a strangulated inguinal hernia may include:  A bulge in your groin that is very painful and tender to the touch.  A bulge that turns red or purple.  Fever, nausea, and vomiting.  Inability to have a bowel movement or to pass gas. How is this diagnosed? This condition is diagnosed based on your symptoms, your medical history, and a physical exam. Your health care provider may feel your groin area and ask you to cough. How is this treated? Treatment depends on the size of your hernia and whether you have symptoms. If you do not have symptoms, your health care provider may have you watch your hernia carefully and have you come in for follow-up visits. If your hernia is large or if you have symptoms, you may need surgery to repair the hernia. Follow these instructions at home: Lifestyle  Avoid lifting heavy objects.  Avoid standing for long periods of time.  Do not use any products that contain nicotine or tobacco, such as cigarettes and e-cigarettes. If you need help quitting, ask your health care provider.  Maintain a healthy weight. Preventing constipation  Take actions to prevent constipation. Constipation leads to straining with bowel movements, which can make a hernia worse  or cause a hernia repair to break down. Your health care provider may recommend that you: ? Drink enough fluid to keep your urine pale yellow. ? Eat foods that are high in fiber, such as fresh fruits and vegetables, whole grains, and beans. ? Limit foods that are high in fat and processed sugars, such as fried or sweet foods. ? Take an over-the-counter or prescription medicine for constipation. General instructions  You may try to push the hernia back in place by  very gently pressing on it while lying down. Do not try to force the bulge back in if it will not push in easily.  Watch your hernia for any changes in shape, size, or color. Get help right away if you notice any changes.  Take over-the-counter and prescription medicines only as told by your health care provider.  Keep all follow-up visits as told by your health care provider. This is important. Contact a health care provider if:  You have a fever.  You develop new symptoms.  Your symptoms get worse. Get help right away if:  You have pain in your groin that suddenly gets worse.  You have a bulge in your groin that: ? Suddenly gets bigger and does not get smaller. ? Becomes red or purple or painful to the touch.  You are a man and you have a sudden pain in your scrotum, or the size of your scrotum suddenly changes.  You cannot push the hernia back in place by very gently pressing on it when you are lying down. Do not try to force the bulge back in if it will not push in easily.  You have nausea or vomiting that does not go away.  You have a fast heartbeat.  You cannot have a bowel movement or pass gas. These symptoms may represent a serious problem that is an emergency. Do not wait to see if the symptoms will go away. Get medical help right away. Call your local emergency services (911 in the [place] Summary  An inguinal hernia develops when fat or the intestines push through a weak spot in a muscle where your leg meets your lower abdomen (groin).  This condition is caused by having a weak spot in muscles or tissue in your groin.  Symptoms may depend on the size of the hernia, and they may include pain or swelling in your groin. A small inguinal hernia often has no symptoms.  Treatment may not be needed if you do not have symptoms. If you have symptoms or a large hernia, you may need surgery to repair the hernia.  Avoid lifting heavy objects. Also avoid standing for long  amounts of time. This information is not intended to replace advice given to you by your health care provider. Make sure you discuss any questions you have with your health care provider. Document Revised: 07/08/2017 Document Reviewed: 03/08/2017 Elsevier Patient Education  2020 [employer].

## 2019-11-29 ENCOUNTER — Other Ambulatory Visit: Payer: Self-pay

## 2019-11-29 ENCOUNTER — Other Ambulatory Visit
Admission: RE | Admit: 2019-11-29 | Discharge: 2019-11-29 | Disposition: A | Discharge status: Home / Self Care | Payer: 59 | Source: Ambulatory Visit | Attending: Surgery | Admitting: Surgery

## 2019-11-29 ENCOUNTER — Ambulatory Visit: Payer: Self-pay | Admitting: Surgery

## 2019-11-29 DIAGNOSIS — Z01812 Encounter for preprocedural laboratory examination: Secondary | ICD-10-CM | POA: Insufficient documentation

## 2019-11-29 DIAGNOSIS — K4090 Unilateral inguinal hernia, without obstruction or gangrene, not specified as recurrent: Secondary | ICD-10-CM

## 2019-11-29 DIAGNOSIS — Z20822 Contact with and (suspected) exposure to covid-19: Secondary | ICD-10-CM | POA: Diagnosis not present

## 2019-11-30 LAB — SARS CORONAVIRUS 2 (TAT 6-24 HRS): SARS Coronavirus 2: NEGATIVE

## 2019-12-02 ENCOUNTER — Other Ambulatory Visit: Payer: Self-pay

## 2019-12-02 ENCOUNTER — Ambulatory Visit: Payer: 59 | Admitting: Anesthesiology

## 2019-12-02 ENCOUNTER — Encounter: Payer: Self-pay | Admitting: Surgery

## 2019-12-02 ENCOUNTER — Ambulatory Visit
Admission: RE | Admit: 2019-12-02 | Discharge: 2019-12-02 | Disposition: A | Discharge status: Home / Self Care | Payer: 59 | Attending: Surgery | Admitting: Surgery

## 2019-12-02 ENCOUNTER — Encounter
Admission: RE | Disposition: A | Discharge status: Home / Self Care | Payer: Self-pay | Source: Home / Self Care | Attending: Surgery

## 2019-12-02 DIAGNOSIS — K4090 Unilateral inguinal hernia, without obstruction or gangrene, not specified as recurrent: Secondary | ICD-10-CM | POA: Diagnosis not present

## 2019-12-02 DIAGNOSIS — I10 Essential (primary) hypertension: Secondary | ICD-10-CM | POA: Diagnosis not present

## 2019-12-02 DIAGNOSIS — Z79899 Other long term (current) drug therapy: Secondary | ICD-10-CM | POA: Insufficient documentation

## 2019-12-02 DIAGNOSIS — Z8249 Family history of ischemic heart disease and other diseases of the circulatory system: Secondary | ICD-10-CM | POA: Diagnosis not present

## 2019-12-02 DIAGNOSIS — Z833 Family history of diabetes mellitus: Secondary | ICD-10-CM | POA: Insufficient documentation

## 2019-12-02 DIAGNOSIS — F17210 Nicotine dependence, cigarettes, uncomplicated: Secondary | ICD-10-CM | POA: Diagnosis not present

## 2019-12-02 HISTORY — PX: XI ROBOTIC ASSISTED INGUINAL HERNIA REPAIR WITH MESH: SHX6706

## 2019-12-02 SURGERY — REPAIR, HERNIA, INGUINAL, ROBOT-ASSISTED, LAPAROSCOPIC, USING MESH
Anesthesia: General | Site: Groin | Laterality: Right

## 2019-12-02 MED ORDER — LIDOCAINE HCL (PF) 2 % IJ SOLN
INTRAMUSCULAR | Status: AC
  Filled 2019-12-02: qty 5

## 2019-12-02 MED ORDER — HYDROCODONE-ACETAMINOPHEN 5-325 MG PO TABS
1.0000 | ORAL_TABLET | Freq: Four times a day (QID) | ORAL | 0 refills | Status: AC | PRN
Start: 2019-12-02 — End: ?

## 2019-12-02 MED ORDER — IBUPROFEN 800 MG PO TABS
800.0000 mg | ORAL_TABLET | Freq: Three times a day (TID) | ORAL | 0 refills | Status: AC | PRN
Start: 2019-12-02 — End: ?

## 2019-12-02 MED ORDER — FAMOTIDINE 20 MG PO TABS
ORAL_TABLET | ORAL | Status: AC
  Administered 2019-12-02: 20 mg via ORAL
  Filled 2019-12-02: qty 1

## 2019-12-02 MED ORDER — CHLORHEXIDINE GLUCONATE 0.12 % MT SOLN: 15.0000 mL | Freq: Once | OROMUCOSAL | Status: AC

## 2019-12-02 MED ORDER — CHLORHEXIDINE GLUCONATE CLOTH 2 % EX PADS
6.0000 | MEDICATED_PAD | Freq: Once | CUTANEOUS | Status: AC
  Administered 2019-12-02: 6 via TOPICAL

## 2019-12-02 MED ORDER — CEFAZOLIN SODIUM-DEXTROSE 2-4 GM/100ML-% IV SOLN
INTRAVENOUS | Status: AC
  Filled 2019-12-02: qty 100

## 2019-12-02 MED ORDER — ACETAMINOPHEN 500 MG PO TABS
ORAL_TABLET | ORAL | Status: AC
  Administered 2019-12-02: 1000 mg via ORAL
  Filled 2019-12-02: qty 2

## 2019-12-02 MED ORDER — BUPIVACAINE LIPOSOME 1.3 % IJ SUSP: 20.0000 mL | Freq: Once | INTRAMUSCULAR | Status: DC

## 2019-12-02 MED ORDER — FENTANYL CITRATE (PF) 100 MCG/2ML IJ SOLN
INTRAMUSCULAR | Status: AC
  Filled 2019-12-02: qty 2

## 2019-12-02 MED ORDER — ONDANSETRON HCL 4 MG/2ML IJ SOLN
INTRAMUSCULAR | Status: DC | PRN
  Administered 2019-12-02: 4 mg via INTRAVENOUS

## 2019-12-02 MED ORDER — HYDROCODONE-ACETAMINOPHEN 5-325 MG PO TABS
ORAL_TABLET | ORAL | Status: AC
  Administered 2019-12-02: 1 via ORAL
  Filled 2019-12-02: qty 1

## 2019-12-02 MED ORDER — GABAPENTIN 300 MG PO CAPS: 300.0000 mg | ORAL_CAPSULE | ORAL | Status: AC

## 2019-12-02 MED ORDER — ROCURONIUM BROMIDE 10 MG/ML (PF) SYRINGE
PREFILLED_SYRINGE | INTRAVENOUS | Status: AC
  Filled 2019-12-02: qty 10

## 2019-12-02 MED ORDER — FAMOTIDINE 20 MG PO TABS: 20.0000 mg | ORAL_TABLET | Freq: Once | ORAL | Status: AC

## 2019-12-02 MED ORDER — BUPIVACAINE LIPOSOME 1.3 % IJ SUSP
INTRAMUSCULAR | Status: AC
  Filled 2019-12-02: qty 20

## 2019-12-02 MED ORDER — PROPOFOL 10 MG/ML IV BOLUS
INTRAVENOUS | Status: DC | PRN
  Administered 2019-12-02: 200 mg via INTRAVENOUS

## 2019-12-02 MED ORDER — FENTANYL CITRATE (PF) 100 MCG/2ML IJ SOLN
INTRAMUSCULAR | Status: AC
  Administered 2019-12-02: 25 ug via INTRAVENOUS
  Filled 2019-12-02: qty 2

## 2019-12-02 MED ORDER — DEXMEDETOMIDINE HCL 200 MCG/2ML IV SOLN
INTRAVENOUS | Status: DC | PRN
  Administered 2019-12-02: 20 ug via INTRAVENOUS

## 2019-12-02 MED ORDER — ORAL CARE MOUTH RINSE: 15.0000 mL | Freq: Once | OROMUCOSAL | Status: AC

## 2019-12-02 MED ORDER — DEXMEDETOMIDINE HCL IN NACL 80 MCG/20ML IV SOLN
INTRAVENOUS | Status: AC
  Filled 2019-12-02: qty 20

## 2019-12-02 MED ORDER — MIDAZOLAM HCL 2 MG/2ML IJ SOLN
INTRAMUSCULAR | Status: AC
  Filled 2019-12-02: qty 2

## 2019-12-02 MED ORDER — HYDROCODONE-ACETAMINOPHEN 5-325 MG PO TABS: 1.0000 | ORAL_TABLET | Freq: Four times a day (QID) | ORAL | Status: DC | PRN

## 2019-12-02 MED ORDER — EPINEPHRINE PF 1 MG/ML IJ SOLN
INTRAMUSCULAR | Status: AC
  Filled 2019-12-02: qty 1

## 2019-12-02 MED ORDER — MIDAZOLAM HCL 2 MG/2ML IJ SOLN
INTRAMUSCULAR | Status: DC | PRN
  Administered 2019-12-02: 2 mg via INTRAVENOUS

## 2019-12-02 MED ORDER — CHLORHEXIDINE GLUCONATE 0.12 % MT SOLN
OROMUCOSAL | Status: AC
  Administered 2019-12-02: 15 mL via OROMUCOSAL
  Filled 2019-12-02: qty 15

## 2019-12-02 MED ORDER — DEXAMETHASONE SODIUM PHOSPHATE 10 MG/ML IJ SOLN
INTRAMUSCULAR | Status: AC
  Filled 2019-12-02: qty 1

## 2019-12-02 MED ORDER — LACTATED RINGERS IV SOLN: INTRAVENOUS | Status: DC

## 2019-12-02 MED ORDER — ROCURONIUM BROMIDE 100 MG/10ML IV SOLN
INTRAVENOUS | Status: DC | PRN
  Administered 2019-12-02: 20 mg via INTRAVENOUS
  Administered 2019-12-02: 10 mg via INTRAVENOUS
  Administered 2019-12-02: 30 mg via INTRAVENOUS

## 2019-12-02 MED ORDER — CELECOXIB 200 MG PO CAPS
ORAL_CAPSULE | ORAL | Status: AC
  Administered 2019-12-02: 200 mg via ORAL
  Filled 2019-12-02: qty 1

## 2019-12-02 MED ORDER — ACETAMINOPHEN 500 MG PO TABS: 1000.0000 mg | ORAL_TABLET | ORAL | Status: AC

## 2019-12-02 MED ORDER — BUPIVACAINE HCL (PF) 0.25 % IJ SOLN
INTRAMUSCULAR | Status: AC
  Filled 2019-12-02: qty 30

## 2019-12-02 MED ORDER — ONDANSETRON HCL 4 MG/2ML IJ SOLN
INTRAMUSCULAR | Status: AC
  Filled 2019-12-02: qty 2

## 2019-12-02 MED ORDER — LIDOCAINE HCL (CARDIAC) PF 100 MG/5ML IV SOSY
PREFILLED_SYRINGE | INTRAVENOUS | Status: DC | PRN
  Administered 2019-12-02: 100 mg via INTRAVENOUS

## 2019-12-02 MED ORDER — CELECOXIB 200 MG PO CAPS: 200.0000 mg | ORAL_CAPSULE | ORAL | Status: AC

## 2019-12-02 MED ORDER — BUPIVACAINE-EPINEPHRINE (PF) 0.25% -1:200000 IJ SOLN
INTRAMUSCULAR | Status: DC | PRN
  Administered 2019-12-02: 30 mL

## 2019-12-02 MED ORDER — SUCCINYLCHOLINE CHLORIDE 200 MG/10ML IV SOSY
PREFILLED_SYRINGE | INTRAVENOUS | Status: AC
  Filled 2019-12-02: qty 10

## 2019-12-02 MED ORDER — SUGAMMADEX SODIUM 500 MG/5ML IV SOLN
INTRAVENOUS | Status: AC
  Filled 2019-12-02: qty 5

## 2019-12-02 MED ORDER — SUCCINYLCHOLINE CHLORIDE 20 MG/ML IJ SOLN
INTRAMUSCULAR | Status: DC | PRN
  Administered 2019-12-02: 120 mg via INTRAVENOUS

## 2019-12-02 MED ORDER — DEXAMETHASONE SODIUM PHOSPHATE 10 MG/ML IJ SOLN
INTRAMUSCULAR | Status: DC | PRN
  Administered 2019-12-02: 10 mg via INTRAVENOUS

## 2019-12-02 MED ORDER — ALBUTEROL SULFATE HFA 108 (90 BASE) MCG/ACT IN AERS
INHALATION_SPRAY | RESPIRATORY_TRACT | Status: AC
  Filled 2019-12-02: qty 6.7

## 2019-12-02 MED ORDER — SUGAMMADEX SODIUM 500 MG/5ML IV SOLN
INTRAVENOUS | Status: DC | PRN
  Administered 2019-12-02: 400 mg via INTRAVENOUS

## 2019-12-02 MED ORDER — EPHEDRINE 5 MG/ML INJ
INTRAVENOUS | Status: AC
  Filled 2019-12-02: qty 10

## 2019-12-02 MED ORDER — GABAPENTIN 300 MG PO CAPS
ORAL_CAPSULE | ORAL | Status: AC
  Administered 2019-12-02: 300 mg via ORAL
  Filled 2019-12-02: qty 1

## 2019-12-02 MED ORDER — LACTATED RINGERS IV SOLN: INTRAVENOUS | Status: DC | PRN

## 2019-12-02 MED ORDER — CEFAZOLIN SODIUM-DEXTROSE 2-4 GM/100ML-% IV SOLN
2.0000 g | INTRAVENOUS | Status: AC
  Administered 2019-12-02: 2 g via INTRAVENOUS

## 2019-12-02 MED ORDER — FENTANYL CITRATE (PF) 100 MCG/2ML IJ SOLN
INTRAMUSCULAR | Status: DC | PRN
  Administered 2019-12-02: 50 ug via INTRAVENOUS

## 2019-12-02 MED ORDER — ONDANSETRON HCL 4 MG/2ML IJ SOLN: 4.0000 mg | Freq: Once | INTRAMUSCULAR | Status: DC | PRN

## 2019-12-02 MED ORDER — FENTANYL CITRATE (PF) 100 MCG/2ML IJ SOLN
25.0000 ug | INTRAMUSCULAR | Status: DC | PRN
  Administered 2019-12-02 (×3): 25 ug via INTRAVENOUS

## 2019-12-02 SURGICAL SUPPLY — 42 items
BLADE CLIPPER SURG (BLADE) ×3 IMPLANT
CANISTER SUCT 1200ML W/VALVE (MISCELLANEOUS) ×3 IMPLANT
CHLORAPREP W/TINT 26 (MISCELLANEOUS) ×3 IMPLANT
COVER TIP SHEARS 8 DVNC (MISCELLANEOUS) ×1 IMPLANT
COVER TIP SHEARS 8MM DA VINCI (MISCELLANEOUS) ×2
COVER WAND RF STERILE (DRAPES) ×6 IMPLANT
DEFOGGER SCOPE WARMER CLEARIFY (MISCELLANEOUS) ×3 IMPLANT
DERMABOND ADVANCED (GAUZE/BANDAGES/DRESSINGS) ×2
DERMABOND ADVANCED .7 DNX12 (GAUZE/BANDAGES/DRESSINGS) ×1 IMPLANT
DRAPE 3/4 80X56 (DRAPES) IMPLANT
DRAPE ARM DVNC X/XI (DISPOSABLE) ×3 IMPLANT
DRAPE COLUMN DVNC XI (DISPOSABLE) ×1 IMPLANT
DRAPE DA VINCI XI ARM (DISPOSABLE) ×6
DRAPE DA VINCI XI COLUMN (DISPOSABLE) ×2
ELECT REM PT RETURN 9FT ADLT (ELECTROSURGICAL) ×3
ELECTRODE REM PT RTRN 9FT ADLT (ELECTROSURGICAL) ×1 IMPLANT
GLOVE ORTHO TXT STRL SZ7.5 (GLOVE) ×18 IMPLANT
GOWN STRL REUS W/ TWL LRG LVL3 (GOWN DISPOSABLE) ×5 IMPLANT
GOWN STRL REUS W/TWL LRG LVL3 (GOWN DISPOSABLE) ×10
GRASPER SUT TROCAR 14GX15 (MISCELLANEOUS) IMPLANT
IRRIGATION STRYKERFLOW (MISCELLANEOUS) IMPLANT
IRRIGATOR STRYKERFLOW (MISCELLANEOUS)
IV CATH ANGIO 14GX1.88 NO SAFE (IV SOLUTION) ×3 IMPLANT
IV NS 1000ML (IV SOLUTION)
IV NS 1000ML BAXH (IV SOLUTION) IMPLANT
KIT PINK PAD W/HEAD ARE REST (MISCELLANEOUS) ×3
KIT PINK PAD W/HEAD ARM REST (MISCELLANEOUS) ×1 IMPLANT
LABEL OR SOLS (LABEL) ×3 IMPLANT
MESH 3DMAX LIGHT 4.8X6.7 RT XL (Mesh General) ×3 IMPLANT
NEEDLE HYPO 22GX1.5 SAFETY (NEEDLE) ×3 IMPLANT
NEEDLE INSUFFLATION 14GA 120MM (NEEDLE) IMPLANT
PACK LAP CHOLECYSTECTOMY (MISCELLANEOUS) ×3 IMPLANT
SEAL CANN UNIV 5-8 DVNC XI (MISCELLANEOUS) ×3 IMPLANT
SEAL XI 5MM-8MM UNIVERSAL (MISCELLANEOUS) ×6
SET TUBE SMOKE EVAC HIGH FLOW (TUBING) ×3 IMPLANT
SOLUTION ELECTROLUBE (MISCELLANEOUS) ×3 IMPLANT
SUT MNCRL 4-0 (SUTURE) ×2
SUT MNCRL 4-0 27XMFL (SUTURE) ×1
SUT VIC AB 0 CT2 27 (SUTURE) ×3 IMPLANT
SUT VLOC 90 S/L VL9 GS22 (SUTURE) ×3 IMPLANT
SUTURE MNCRL 4-0 27XMF (SUTURE) ×1 IMPLANT
TROCAR Z-THREAD FIOS 11X100 BL (TROCAR) ×3 IMPLANT

## 2019-12-02 NOTE — Anesthesia Preprocedure Evaluation (Signed)
Anesthesia Evaluation  Patient identified by MRN, date of birth, ID band Patient awake    Reviewed: Allergy & Precautions, NPO status , Patient's Chart, lab work & pertinent test results  Airway Mallampati: II  TM Distance: >3 FB     Dental   Pulmonary Current Smoker,    Pulmonary exam normal        Cardiovascular hypertension, Pt. on medications Normal cardiovascular exam     Neuro/Psych negative neurological ROS  negative psych ROS   GI/Hepatic Neg liver ROS,   Endo/Other  negative endocrine ROS  Renal/GU negative Renal ROS  negative genitourinary   Musculoskeletal negative musculoskeletal ROS (+)   Abdominal Normal abdominal exam  (+)   Peds negative pediatric ROS (+)  Hematology negative hematology ROS (+)   Anesthesia Other Findings Past Medical History: No date: Hypertension  Reproductive/Obstetrics                             Anesthesia Physical Anesthesia Plan  ASA: II  Anesthesia Plan: General   Post-op Pain Management:    Induction: Intravenous  PONV Risk Score and Plan:   Airway Management Planned: Oral ETT  Additional Equipment:   Intra-op Plan:   Post-operative Plan: Extubation in OR  Informed Consent: I have reviewed the patients History and Physical, chart, labs and discussed the procedure including the risks, benefits and alternatives for the proposed anesthesia with the patient or authorized representative who has indicated his/her understanding and acceptance.     Dental advisory given  Plan Discussed with: CRNA and Surgeon  Anesthesia Plan Comments:         Anesthesia Quick Evaluation

## 2019-12-02 NOTE — Discharge Instructions (Signed)
AMBULATORY SURGERY  DISCHARGE INSTRUCTIONS   1) The drugs that you were given will stay in your system until tomorrow so for the next 24 hours you should not:  A) Drive an automobile B) Make any legal decisions C) Drink any alcoholic beverage   2) You may resume regular meals tomorrow.  Today it is better to start with liquids and gradually work up to solid foods.  You may eat anything you prefer, but it is better to start with liquids, then soup and crackers, and gradually work up to solid foods.   3) Please notify your doctor immediately if you have any unusual bleeding, trouble breathing, redness and pain at the surgery site, drainage, fever, or pain not relieved by medication.    4) Additional Instructions:        Please contact your physician with any problems or Same Day Surgery at [phone], Monday through Friday 6 am to 4 pm, or [place] at [facility] number at [phone].Bupivacaine Liposomal Suspension for Injection What is this medicine? BUPIVACAINE LIPOSOMAL (bue PIV a kane LIP oh som al) is an anesthetic. It causes loss of feeling in the skin or other tissues. It is used to prevent and to treat pain from some procedures. This medicine may be used for other purposes; ask your health care provider or pharmacist if you have questions. COMMON BRAND NAME(S): EXPAREL What should I tell my health care provider before I take this medicine? They need to know if you have any of these conditions:  G6PD deficiency  heart disease  kidney disease  liver disease  low blood pressure  lung or breathing disease, like asthma  an unusual or allergic reaction to bupivacaine, other medicines, foods, dyes, or preservatives  pregnant or trying to get pregnant  breast-feeding How should I use this medicine? This medicine is for injection into the affected area. It is given by a health care professional in a hospital or clinic setting. Talk to your pediatrician  regarding the use of this medicine in children. Special care may be needed. Overdosage: If you think you have taken too much of this medicine contact a poison control center or emergency room at once. NOTE: This medicine is only for you. Do not share this medicine with others. What if I miss a dose? This does not apply. What may interact with this medicine? This medicine may interact with the following medications:  acetaminophen  certain antibiotics like dapsone, nitrofurantoin, aminosalicylic acid, sulfonamides  certain medicines for seizures like phenobarbital, phenytoin, valproic acid  chloroquine  cyclophosphamide  flutamide  hydroxyurea  ifosfamide  metoclopramide  nitric oxide  nitroglycerin  nitroprusside  nitrous oxide  other local anesthetics like lidocaine, pramoxine, tetracaine  primaquine  quinine  rasburicase  sulfasalazine This list may not describe all possible interactions. Give your health care provider a list of all the medicines, herbs, non-prescription drugs, or dietary supplements you use. Also tell them if you smoke, drink alcohol, or use illegal drugs. Some items may interact with your medicine. What should I watch for while using this medicine? Your condition will be monitored carefully while you are receiving this medicine. Be careful to avoid injury while the area is numb, and you are not aware of pain. What side effects may I notice from receiving this medicine? Side effects that you should report to your doctor or health care professional as soon as possible:  allergic reactions like skin rash, itching or hives, swelling of the face, lips, or tongue  seizures  signs and symptoms of a dangerous change in heartbeat or heart rhythm like chest pain; dizziness; fast, irregular heartbeat; palpitations; feeling faint or lightheaded; falls; breathing problems  signs and symptoms of methemoglobinemia such as pale, gray, or blue colored skin;  headache; fast heartbeat; shortness of breath; feeling faint or lightheaded, falls; tiredness Side effects that usually do not require medical attention (report to your doctor or health care professional if they continue or are bothersome):  anxious  back pain  changes in taste  changes in vision  constipation  dizziness  fever  nausea, vomiting This list may not describe all possible side effects. Call your doctor for medical advice about side effects. You may report side effects to FDA at 1-800-FDA-1088. Where should I keep my medicine? This drug is given in a hospital or clinic and will not be stored at home. NOTE: This sheet is a summary. It may not cover all possible information. If you have questions about this medicine, talk to your doctor, pharmacist, or health care provider.  2020 Elsevier/Gold Standard (2019-03-19 10:48:23)    AMBULATORY SURGERY  DISCHARGE INSTRUCTIONS   5) The drugs that you were given will stay in your system until tomorrow so for the next 24 hours you should not:  D) Drive an automobile E) Make any legal decisions F) Drink any alcoholic beverage   6) You may resume regular meals tomorrow.  Today it is better to start with liquids and gradually work up to solid foods.  You may eat anything you prefer, but it is better to start with liquids, then soup and crackers, and gradually work up to solid foods.   7) Please notify your doctor immediately if you have any unusual bleeding, trouble breathing, redness and pain at the surgery site, drainage, fever, or pain not relieved by medication.    8) Additional Instructions:        Please contact your physician with any problems or Same Day Surgery at [phone], Monday through Friday 6 am to 4 pm, or [place] at [facility] number at [phone].

## 2019-12-02 NOTE — Anesthesia Procedure Notes (Signed)
Procedure Name: Intubation Date/Time: 12/02/2019 12:05 PM Performed by: Almeta Monas, CRNA Pre-anesthesia Checklist: Patient identified, Patient being monitored, Timeout performed, Emergency Drugs available and Suction available Patient Re-evaluated:Patient Re-evaluated prior to induction Oxygen Delivery Method: Circle system utilized Preoxygenation: Pre-oxygenation with 100% oxygen Induction Type: IV induction Ventilation: Mask ventilation without difficulty Laryngoscope Size: 3 and Glidescope Grade View: Grade II Tube type: Oral Tube size: 7.0 mm Number of attempts: 1 Airway Equipment and Method: Stylet and Video-laryngoscopy Placement Confirmation: ETT inserted through vocal cords under direct vision,  positive ETCO2 and breath sounds checked- equal and bilateral Secured at: 21 cm Tube secured with: Tape Dental Injury: Teeth and Oropharynx as per pre-operative assessment  Difficulty Due To: Difficulty was anticipated and Difficult Airway- due to anterior larynx Future Recommendations: Recommend- induction with short-acting agent, and alternative techniques readily available

## 2019-12-02 NOTE — Interval H&P Note (Signed)
History and Physical Interval Note:  12/02/2019 11:58 AM  Paul Good  has presented today for surgery, with the diagnosis of Right inguinal hernia, possible bilateral.  The various methods of treatment have been discussed with the patient and family. After consideration of risks, benefits and other options for treatment, the patient has consented to  Procedure(s): XI ROBOTIC ASSISTED INGUINAL HERNIA REPAIR WITH MESH (Right) as a surgical intervention.  The patient's history has been reviewed, patient examined, no change in status, stable for surgery.  I have reviewed the patient's chart and labs.  Questions were answered to the patient's satisfaction.   The right side is marked.   Campbell Lerner  Campbell Lerner, M.D., [facility]  12/02/2019 ; 11:58 AM

## 2019-12-02 NOTE — Op Note (Signed)
Robotic assisted Laparoscopic Transabdominal  right inguinal Hernia Repair with Mesh       Pre-operative Diagnosis:   Right inguinal Hernia   Post-operative Diagnosis: Same   Procedure: Robotic assisted Laparoscopic  repair of right direct inguinal hernia(s)   Surgeon: Campbell Lerner, M.D., FACS   Anesthesia: GETA   Findings:  Right direct inguinal hernia, no evidence of left sided hernia.         Procedure Details  The patient was seen again in the Holding Room. The benefits, complications, treatment options, and expected outcomes were discussed with the patient. The risks of bleeding, infection, recurrence of symptoms, failure to resolve symptoms, recurrence of hernia, ischemic orchitis, chronic pain syndrome or neuroma, were reviewed again. The likelihood of improving the patient's symptoms with return to their baseline status is good.  The patient and/or family concurred with the proposed plan, giving informed consent.  The patient was taken to Operating Room, identified  and the procedure verified as Laparoscopic Inguinal Hernia Repair. Laterality confirmed.  A Time Out was held and the above information confirmed.   Prior to the induction of general anesthesia, antibiotic prophylaxis was administered. VTE prophylaxis was in place. General endotracheal anesthesia was then administered and tolerated well. After the induction, the abdomen was prepped with Chloraprep and draped in the sterile fashion. The patient was positioned in the supine position.   After local infiltration of quarter percent Marcaine with epinephrine, stab incision was made left upper quadrant.  Just below the costal margin approximately midclavicular line the Veress needle is passed with sensation of the layers to penetrate the abdominal wall and into the peritoneum.  Saline drop test is confirmed peritoneal placement.  Insufflation is initiated with carbon dioxide to pressures of 15 mmHg. An 8.5 mm port is placed to  the left off of the midline, with blunt tipped trocar.  Pneumoperitoneum maintained w/o HD changes. No evidence of bowel injuries.  Two 8.5 mm ports placed under direct vision. The laparoscopy revealed large right sided direct defect.   The robot was brought ot the table and docked in the standard fashion, no collision between arms was observed. Instruments were kept under direct view at all times. For right inguinal hernia repair,  I developed a peritoneal flap. The medial/questionable bladder involved direct hernia sac was reduced and dissected free from adjacent structures. We preserved the vas and the vessels, and visualized them to their convergence and beyond in the retroperitoneum. Once dissection was completed a large right sided BARD 3D Light mesh was placed and secured at three points with interrupted 2-0 Vicryl to the pubic tubercle and anteriorly.  Pics taken There was good coverage of the direct, indirect and femoral spaces. A large angiocath is placed under direct visualization in the groin to reduce trapped extraperitoneal air and confirm adequate peritoneal closure.  Second look revealed no complications or injuries.  The flap was then closed with 2-0 V-lock suture.  Peritoneal closure without defects.  Once assuring that hemostasis was adequate, all needles/sponges removed, and the robot was undocked.  The ports were removed, the abdomen desulflated.  4-0 subcuticular Monocryl was used at all skin edges. Dermabond was placed.  Patient tolerated the procedure well. There were no complications. He was taken to the recovery room in stable condition.           Campbell Lerner, M.D., FACS 12/02/2019, 1:46 PM

## 2019-12-02 NOTE — Transfer of Care (Signed)
Immediate Anesthesia Transfer of Care Note  Patient: Paul Good  Procedure(s) Performed: XI ROBOTIC ASSISTED INGUINAL HERNIA REPAIR WITH MESH (Right Groin)  Patient Location: PACU  Anesthesia Type:General  Level of Consciousness: sedated  Airway & Oxygen Therapy: Patient Spontanous Breathing and Patient connected to face mask oxygen  Post-op Assessment: Report given to RN and Post -op Vital signs reviewed and stable  Post vital signs: Reviewed and stable  Last Vitals:  Vitals Value Taken Time  BP 139/74 12/02/19 1344  Temp 36.4 C 12/02/19 1344  Pulse 72 12/02/19 1350  Resp 18 12/02/19 1350  SpO2 100 % 12/02/19 1350  Vitals shown include unvalidated device data.  Last Pain:  Vitals:   12/02/19 1344  TempSrc:   PainSc: 0-No pain         Complications: No complications documented.

## 2019-12-03 ENCOUNTER — Telehealth: Payer: Self-pay

## 2019-12-03 ENCOUNTER — Encounter: Payer: Self-pay | Admitting: Surgery

## 2019-12-03 NOTE — Telephone Encounter (Signed)
Patient's fiance called stating patient had hernia repair surgery yesterday and was experiencing constipation and still having pain. Patient asked if she could give patient the hydrocodone along with tramadol. Per Ian Malkin that should be fine, patient should also try over the counter Miralax along with a stool softener. Patient's fiance verbalized understanding and has no further questions. Instructed to give our office a call back if symptoms worsen.

## 2019-12-03 NOTE — Anesthesia Postprocedure Evaluation (Signed)
Anesthesia Post Note  Patient: Paul Good  Procedure(s) Performed: XI ROBOTIC ASSISTED INGUINAL HERNIA REPAIR WITH MESH (Right Groin)  Patient location during evaluation: PACU Anesthesia Type: General Level of consciousness: awake and alert and oriented Pain management: pain level controlled Vital Signs Assessment: post-procedure vital signs reviewed and stable Respiratory status: spontaneous breathing Cardiovascular status: blood pressure returned to baseline Anesthetic complications: no   No complications documented.   Last Vitals:  Vitals:   12/02/19 1518 12/02/19 1540  BP: (!) 147/90 (!) 149/95  Pulse: 79 70  Resp: 16 16  Temp: (!) 36.3 C 36.4 C  SpO2: 98% 99%    Last Pain:  Vitals:   12/03/19 0956  TempSrc:   PainSc: 6                  Lilienne Weins

## 2019-12-04 ENCOUNTER — Telehealth: Payer: Self-pay | Admitting: Emergency Medicine

## 2019-12-04 ENCOUNTER — Telehealth: Payer: Self-pay | Admitting: *Deleted

## 2019-12-04 NOTE — Telephone Encounter (Signed)
Faxed FMLA paperwork to [employer] at [phone]

## 2019-12-04 NOTE — Telephone Encounter (Signed)
Paul Good called stating they had dropped off patients FMLA paperwork. States she called the work place and they didn't have it. Paul Good wants a return call about this paperwork.

## 2019-12-05 ENCOUNTER — Ambulatory Visit: Payer: Self-pay | Admitting: Surgery

## 2019-12-12 ENCOUNTER — Ambulatory Visit (INDEPENDENT_AMBULATORY_CARE_PROVIDER_SITE_OTHER): Payer: 59 | Admitting: Surgery

## 2019-12-12 ENCOUNTER — Other Ambulatory Visit: Payer: Self-pay

## 2019-12-12 ENCOUNTER — Encounter: Payer: Self-pay | Admitting: Surgery

## 2019-12-12 VITALS — BP 152/121 | HR 85 | Temp 97.9°F | Ht 72.0 in | Wt 206.0 lb

## 2019-12-12 DIAGNOSIS — Z8719 Personal history of other diseases of the digestive system: Secondary | ICD-10-CM

## 2019-12-12 DIAGNOSIS — Z9889 Other specified postprocedural states: Secondary | ICD-10-CM

## 2019-12-12 NOTE — Patient Instructions (Addendum)
You may return to work any time with lifting restrictions.  GENERAL POST-OPERATIVE PATIENT INSTRUCTIONS   WOUND CARE INSTRUCTIONS:  Keep a dry clean dressing on the wound if there is drainage. The initial bandage may be removed after 24 hours.  Once the wound has quit draining you may leave it open to air.  If clothing rubs against the wound or causes irritation and the wound is not draining you may cover it with a dry dressing during the daytime.  Try to keep the wound dry and avoid ointments on the wound unless directed to do so.  If the wound becomes bright red and painful or starts to drain infected material that is not clear, please contact your physician immediately.  If the wound is mildly pink and has a thick firm ridge underneath it, this is normal, and is referred to as a healing ridge.  This will resolve over the next 4-6 weeks.  BATHING: You may shower if you have been informed of this by your surgeon. However, Please do not submerge in a tub, hot tub, or pool until incisions are completely sealed or have been told by your surgeon that you may do so.  DIET:  You may eat any foods that you can tolerate.  It is a good idea to eat a high fiber diet and take in plenty of fluids to prevent constipation.  If you do become constipated you may want to take a mild laxative or take ducolax tablets on a daily basis until your bowel habits are regular.  Constipation can be very uncomfortable, along with straining, after recent surgery.  ACTIVITY:  You are encouraged to cough and deep breath or use your incentive spirometer if you were given one, every 15-30 minutes when awake.  This will help prevent respiratory complications and low grade fevers post-operatively if you had a general anesthetic.  You may want to hug a pillow when coughing and sneezing to add additional support to the surgical area, if you had abdominal or chest surgery, which will decrease pain during these times.  You are encouraged to  walk and engage in light activity for the next two weeks.  You should not lift more than 20 pounds for 4-6 weeks after surgery as it could put you at increased risk for complications. Twenty pounds is roughly equivalent to a plastic bag of groceries. At that time- Listen to your body when lifting, if you have pain when lifting, stop and then try again in a few days. Soreness after doing exercises or activities of daily living is normal as you get back in to your normal routine.  MEDICATIONS:  Try to take narcotic medications and anti-inflammatory medications, such as tylenol, ibuprofen, naprosyn, etc., with food.  This will minimize stomach upset from the medication.  Should you develop nausea and vomiting from the pain medication, or develop a rash, please discontinue the medication and contact your physician.  You should not drive, make important decisions, or operate machinery when taking narcotic pain medication.  SUNBLOCK Use sun block to incision area over the next year if this area will be exposed to sun. This helps decrease scarring and will allow you avoid a permanent darkened area over your incision.  QUESTIONS:  Please feel free to call our office if you have any questions, and we will be glad to assist you.

## 2019-12-12 NOTE — Progress Notes (Signed)
[facility] POST-OP OFFICE VISIT  12/12/2019  HPI: Paul Good is a 39 y.o. male 10 days s/p robotic right inguinal hernia repair.  He feels he is not ready to resume lifting, but otherwise has no complaints of pain or discomfort.  Denies swelling in the groin, or scrotal region.  Reports good pain control.  Was constipated for 3 days postop hydro codone  Vital signs: BP (!) 152/121   Pulse 85   Temp 97.9 F (36.6 C)   Ht 6' (1.829 m)   Wt 206 lb (93.4 kg)   SpO2 95%   BMI 27.94 kg/m    Physical Exam: Constitutional: Appears well Abdomen: Soft, benign. Skin: Incisions are clean, dry and intact.  Assessment/Plan: This is a 39 y.o. male 10 days s/p robotic right inguinal hernia repair  There are no problems to display for this patient.   -May return to work a week from Monday, to remain at light duty for a total of 6 weeks postop and then restriction free.  Follow-up as needed.   Campbell Lerner M.D., FACS 12/12/2019, 11:12 AM

## 2022-08-15 ENCOUNTER — Ambulatory Visit: Payer: Self-pay | Admitting: Family Medicine

## 2022-08-15 ENCOUNTER — Encounter: Payer: Self-pay | Admitting: Family Medicine

## 2022-08-15 DIAGNOSIS — Z113 Encounter for screening for infections with a predominantly sexual mode of transmission: Secondary | ICD-10-CM

## 2022-08-15 LAB — HM HIV SCREENING LAB: HM HIV Screening: NEGATIVE

## 2022-08-15 LAB — GRAM STAIN

## 2022-08-15 NOTE — Progress Notes (Signed)
[facility] Department STI clinic/screening visit  Subjective:  Paul Good is a 42 y.o. male being seen today for an STI screening visit. The patient reports they do not have symptoms.    Patient has the following medical conditions:   Patient Active Problem List   Diagnosis Date Noted   Status post laparoscopic hernia repair 12/12/2019     Chief Complaint  Patient presents with   SEXUALLY TRANSMITTED DISEASE    Screening- patient complaining of burning when urinating pain in groin and crusty eyes     HPI  Patient reports to clinic with a 1-2 month history of testicular pain, and burning at the tip of his penis with urination. "A few days ago" my eye became pink and crusty and I think that means I have chlamydia.  Last HIV test per patient/review of record was  Lab Results  Component Value Date   HMHIVSCREEN Negative - Validated 01/22/2019   No results found for: "HIV"  Does the patient or their partner desires a pregnancy in the next year? No  Screening for MPX risk: Does the patient have an unexplained rash? No Is the patient MSM? No Does the patient endorse multiple sex partners or anonymous sex partners? No Did the patient have close or sexual contact with a person diagnosed with MPX? No Has the patient traveled outside the [place] where MPX is endemic? No Is there a high clinical suspicion for MPX-- evidenced by one of the following No  -Unlikely to be chickenpox  -Lymphadenopathy  -Rash that present in same phase of evolution on any given body part   See flowsheet for further details and programmatic requirements.   Immunization History  Administered Date(s) Administered   Tdap 12/15/2017     The following portions of the patient's history were reviewed and updated as appropriate: allergies, current medications, past medical history, past social history, past surgical history and problem list.  Objective:  There were no vitals filed for this  visit.  Physical Exam Constitutional:      Appearance: Normal appearance.  HENT:     Head: Normocephalic and atraumatic.     Comments: No nits or hair loss    Mouth/Throat:     Mouth: Mucous membranes are moist. No oral lesions.     Pharynx: Oropharynx is clear. No oropharyngeal exudate or posterior oropharyngeal erythema.  Eyes:     General:        Right eye: Discharge present.        Left eye: No discharge.     Conjunctiva/sclera:     Right eye: Right conjunctiva is injected. Exudate present.     Left eye: Left conjunctiva is not injected. No exudate. Pulmonary:     Effort: Pulmonary effort is normal.  Abdominal:     General: Abdomen is flat.     Palpations: Abdomen is soft. There is no hepatomegaly or mass.     Tenderness: There is no abdominal tenderness. There is no rebound.     Hernia: There is no hernia in the left inguinal area or right inguinal area.  Genitourinary:    Pubic Area: No rash or pubic lice (no nits).      Penis: Normal. No tenderness, discharge, swelling or lesions.      Testes: Normal.     Epididymis:     Right: Normal. No mass or tenderness.     Left: Normal. No mass or tenderness.     Rectum: Normal. No tenderness (no lesions  or discharge).     Comments: Penile Discharge Amount: none Color:  none Lymphadenopathy:     Head:     Right side of head: No preauricular or posterior auricular adenopathy.     Left side of head: No preauricular or posterior auricular adenopathy.     Cervical: No cervical adenopathy.     Upper Body:     Right upper body: No supraclavicular, axillary or epitrochlear adenopathy.     Left upper body: No supraclavicular, axillary or epitrochlear adenopathy.     Lower Body: No right inguinal adenopathy. No left inguinal adenopathy.  Skin:    General: Skin is warm and dry.     Findings: No lesion or rash.  Neurological:     Mental Status: He is alert and oriented to person, place, and time.     Assessment and Plan:  Paul Good is a 42 y.o. male presenting to the [place] for STI screening  1. Screening for venereal disease -testing today for STIs- reviewed with patient that his eye looks like conjunctivitis-- discussed that he would need to see a PCP or urgent care for tx of this, as I cannot prescribe meds for that- pt in agreement to go to urgent care as his gram stain was negative in office today  - [facility] - Syphilis Serology, [facility] - Chlamydia/GC NAA, Confirmation - Gram stain   Patient does have STI symptoms Patient accepted all screenings including  urine GC/Chlamydia, and blood work for HIV/Syphilis. Patient meets criteria for HepB screening? No. Ordered? not applicable Patient meets criteria for HepC screening? No. Ordered? not applicable Recommended condom use with all sex Discussed importance of condom use for STI prevent  Treat positive test results per standing order. Discussed time line for [facility] results and that patient will be called with positive results and encouraged patient to call if he had not heard in 2 weeks Recommended repeat testing in 3 months with positive results. Recommended returning for continued or worsening symptoms.   Return if symptoms worsen or fail to improve.  Total time spent 30 minutes  Sharlet Sal[place], [place]

## 2022-08-15 NOTE — Progress Notes (Signed)
Pt here for STI screening.  Gram stain results reviewed with patient.  No treatment needed at this time.  Pt counseled to follow up with Urgent Care Center or PCP regarding eye issues.  Verbalizes understanding.  Condoms given.-Forest Becker, RN

## 2022-08-17 LAB — CHLAMYDIA/GC NAA, CONFIRMATION
Chlamydia trachomatis, NAA: NEGATIVE
Neisseria gonorrhoeae, NAA: NEGATIVE
